# Patient Record
Sex: Female | Born: 1990 | Race: Black or African American | Hispanic: No | Marital: Single | Smoking: Never smoker
Health system: Southern US, Community
[De-identification: ages and names within clinical notes are randomized; demographics above are authoritative.]

## PROBLEM LIST (undated history)

## (undated) DIAGNOSIS — G35 Multiple sclerosis: Secondary | ICD-10-CM

## (undated) DIAGNOSIS — N809 Endometriosis, unspecified: Secondary | ICD-10-CM

## (undated) DIAGNOSIS — J45909 Unspecified asthma, uncomplicated: Secondary | ICD-10-CM

## (undated) HISTORY — PX: ABDOMINAL HYSTERECTOMY: SHX81

## (undated) HISTORY — PX: DILATION AND EVACUATION: SHX1459

---

## 2015-04-24 ENCOUNTER — Encounter (HOSPITAL_COMMUNITY): Payer: Self-pay | Admitting: *Deleted

## 2015-04-24 ENCOUNTER — Inpatient Hospital Stay (HOSPITAL_COMMUNITY)
Admission: AD | Admit: 2015-04-24 | Discharge: 2015-04-24 | Disposition: A | Discharge status: Home / Self Care | Payer: Medicaid - Out of State | Source: Ambulatory Visit | Attending: Obstetrics & Gynecology | Admitting: Obstetrics & Gynecology

## 2015-04-24 DIAGNOSIS — R05 Cough: Secondary | ICD-10-CM | POA: Diagnosis present

## 2015-04-24 DIAGNOSIS — J069 Acute upper respiratory infection, unspecified: Secondary | ICD-10-CM | POA: Insufficient documentation

## 2015-04-24 DIAGNOSIS — J45909 Unspecified asthma, uncomplicated: Secondary | ICD-10-CM | POA: Insufficient documentation

## 2015-04-24 DIAGNOSIS — J4521 Mild intermittent asthma with (acute) exacerbation: Secondary | ICD-10-CM | POA: Diagnosis not present

## 2015-04-24 HISTORY — DX: Unspecified asthma, uncomplicated: J45.909

## 2015-04-24 LAB — POCT PREGNANCY, URINE: PREG TEST UR: NEGATIVE

## 2015-04-24 MED ORDER — ALBUTEROL SULFATE HFA 108 (90 BASE) MCG/ACT IN AERS: 1.0000 | INHALATION_SPRAY | Freq: Four times a day (QID) | RESPIRATORY_TRACT | Status: DC | PRN

## 2015-04-24 NOTE — MAU Note (Signed)
Pt presents to MAU with complaints of pain in her back and chest since yesterday. Reports cough for approximately 3 days.

## 2015-04-24 NOTE — Discharge Instructions (Signed)
Upper Respiratory Infection, Adult An upper respiratory infection (URI) is also sometimes known as the common cold. The upper respiratory tract includes the nose, sinuses, throat, trachea, and bronchi. Bronchi are the airways leading to the lungs. Most people improve within 1 week, but symptoms can last up to 2 weeks. A residual cough may last even longer.  CAUSES Many different viruses can infect the tissues lining the upper respiratory tract. The tissues become irritated and inflamed and often become very moist. Mucus production is also common. A cold is contagious. You can easily spread the virus to others by oral contact. This includes kissing, sharing a glass, coughing, or sneezing. Touching your mouth or nose and then touching a surface, which is then touched by another person, can also spread the virus. SYMPTOMS  Symptoms typically develop 1 to 3 days after you come in contact with a cold virus. Symptoms vary from person to person. They may include:  Runny nose.  Sneezing.  Nasal congestion.  Sinus irritation.  Sore throat.  Loss of voice (laryngitis).  Cough.  Fatigue.  Muscle aches.  Loss of appetite.  Headache.  Low-grade fever. DIAGNOSIS  You might diagnose your own cold based on familiar symptoms, since most people get a cold 2 to 3 times a year. Your caregiver can confirm this based on your exam. Most importantly, your caregiver can check that your symptoms are not due to another disease such as strep throat, sinusitis, pneumonia, asthma, or epiglottitis. Blood tests, throat tests, and X-rays are not necessary to diagnose a common cold, but they may sometimes be helpful in excluding other more serious diseases. Your caregiver will decide if any further tests are required. RISKS AND COMPLICATIONS  You may be at risk for a more severe case of the common cold if you smoke cigarettes, have chronic heart disease (such as heart failure) or lung disease (such as asthma), or if  you have a weakened immune system. The very young and very old are also at risk for more serious infections. Bacterial sinusitis, middle ear infections, and bacterial pneumonia can complicate the common cold. The common cold can worsen asthma and chronic obstructive pulmonary disease (COPD). Sometimes, these complications can require emergency medical care and may be life-threatening. PREVENTION  The best way to protect against getting a cold is to practice good hygiene. Avoid oral or hand contact with people with cold symptoms. Wash your hands often if contact occurs. There is no clear evidence that vitamin C, vitamin E, echinacea, or exercise reduces the chance of developing a cold. However, it is always recommended to get plenty of rest and practice good nutrition. TREATMENT  Treatment is directed at relieving symptoms. There is no cure. Antibiotics are not effective, because the infection is caused by a virus, not by bacteria. Treatment may include:  Increased fluid intake. Sports drinks offer valuable electrolytes, sugars, and fluids.  Breathing heated mist or steam (vaporizer or shower).  Eating chicken soup or other clear broths, and maintaining good nutrition.  Getting plenty of rest.  Using gargles or lozenges for comfort.  Controlling fevers with ibuprofen or acetaminophen as directed by your caregiver.  Increasing usage of your inhaler if you have asthma. Zinc gel and zinc lozenges, taken in the first 24 hours of the common cold, can shorten the duration and lessen the severity of symptoms. Pain medicines may help with fever, muscle aches, and throat pain. A variety of non-prescription medicines are available to treat congestion and runny nose. Your caregiver   can make recommendations and may suggest nasal or lung inhalers for other symptoms.  HOME CARE INSTRUCTIONS   Only take over-the-counter or prescription medicines for pain, discomfort, or fever as directed by your  caregiver.  Use a warm mist humidifier or inhale steam from a shower to increase air moisture. This may keep secretions moist and make it easier to breathe.  Drink enough water and fluids to keep your urine clear or pale yellow.  Rest as needed.  Return to work when your temperature has returned to normal or as your caregiver advises. You may need to stay home longer to avoid infecting others. You can also use a face mask and careful hand washing to prevent spread of the virus. SEEK MEDICAL CARE IF:   After the first few days, you feel you are getting worse rather than better.  You need your caregiver's advice about medicines to control symptoms.  You develop chills, worsening shortness of breath, or brown or red sputum. These may be signs of pneumonia.  You develop yellow or brown nasal discharge or pain in the face, especially when you bend forward. These may be signs of sinusitis.  You develop a fever, swollen neck glands, pain with swallowing, or white areas in the back of your throat. These may be signs of strep throat. SEEK IMMEDIATE MEDICAL CARE IF:   You have a fever.  You develop severe or persistent headache, ear pain, sinus pain, or chest pain.  You develop wheezing, a prolonged cough, cough up blood, or have a change in your usual mucus (if you have chronic lung disease).  You develop sore muscles or a stiff neck. Document Released: 05/23/2001 Document Revised: 02/19/2012 Document Reviewed: 03/04/2014 Exit[employer] Patient Information 2015 [employer]. This information is not intended to replace advice given to you by your health care provider. Make sure you discuss any questions you have with your health care provider.  

## 2015-04-24 NOTE — MAU Provider Note (Signed)
  History     CSN: [id number]  Arrival date and time: 04/24/15 7510   First Provider Initiated Contact with Patient 04/24/15 [phone]      Chief Complaint  Patient presents with  . Nasal Congestion  . Asthma   HPI Leah Burns 24 y.o. [phone] nonpregnant female presents with 3 days of coughing, chest pain, yellow nasal discharge, sore throat.  Cough is productive of yellow-green mucus.  She reports temp of 101.1 yesterday one time but none since.  She has asthma but has lost her inhaler.  She denies nausea, vomiting, dysuria, vaginal bleeding or discharge.  Her best friend has recently had walking pneumonia.  Mucinex last night was not helpful.   OB History    Gravida Para Term Preterm AB TAB SAB Ectopic Multiple Living   2 2 2       2       Past Medical History  Diagnosis Date  . Asthma     Past Surgical History  Procedure Laterality Date  . Dilation and evacuation      History reviewed. No pertinent family history.  History  Substance Use Topics  . Smoking status: Never Smoker   . Smokeless tobacco: Not on file  . Alcohol Use: 0.6 oz/week    1 Glasses of wine per week    Allergies: No Known Allergies  Prescriptions prior to admission  Medication Sig Dispense Refill Last Dose  . gabapentin (NEURONTIN) 300 MG capsule Take 300 mg by mouth 2 (two) times daily.   Past Week at Unknown time    ROS Pertinent ROS in HPI.  All other systems are negative.   Physical Exam   Blood pressure 132/78, pulse 95, temperature 97.5 F (36.4 C), resp. rate 18, height 5\' 4"  (1.626 m), weight 151 lb (68.493 kg), last menstrual period 10/11/2013, SpO2 100 %.  Physical Exam  Constitutional: She is oriented to person, place, and time. She appears well-developed and well-nourished.  HENT:  Head: Normocephalic and atraumatic.  Throat is slightly erythematous.  No exudate.   Nares appear boggy  Eyes: EOM are normal.  Neck: Normal range of motion.  No cervical lymphadenopathy.    Cardiovascular: Normal rate, regular rhythm and normal heart sounds.   Respiratory: Effort normal and breath sounds normal. No respiratory distress.  GI: Soft. There is no tenderness.  Musculoskeletal: Normal range of motion.  Neurological: She is alert and oriented to person, place, and time.  Skin: Skin is warm and dry.  Psychiatric: She has a normal mood and affect.    MAU Course  Procedures  MDM Vitals are stable.  No evidence of bacterial infection.  Lungs are clear with O2 sat of 100%.   Presumed URI in setting of h/o asthma  Assessment and Plan  A: Upper Respiratory Infection Asthma  P: Discharge to home Albuterol MDI OTC cold medication Follow up Urgent Care if no improvement in several days.  Paticia Stack 04/24/2015, 9:40 AM

## 2017-07-20 ENCOUNTER — Other Ambulatory Visit: Payer: Self-pay | Admitting: Obstetrics and Gynecology

## 2017-07-20 DIAGNOSIS — Z1231 Encounter for screening mammogram for malignant neoplasm of breast: Secondary | ICD-10-CM

## 2017-07-26 ENCOUNTER — Ambulatory Visit: Payer: Medicaid - Out of State

## 2018-01-25 DIAGNOSIS — L0390 Cellulitis, unspecified: Secondary | ICD-10-CM | POA: Diagnosis not present

## 2018-01-26 DIAGNOSIS — N760 Acute vaginitis: Secondary | ICD-10-CM | POA: Diagnosis not present

## 2019-07-14 ENCOUNTER — Emergency Department (HOSPITAL_COMMUNITY)
Admission: EM | Admit: 2019-07-14 | Discharge: 2019-07-14 | Disposition: A | Discharge status: Home / Self Care | Payer: Medicaid - Out of State | Attending: Emergency Medicine | Admitting: Emergency Medicine

## 2019-07-14 ENCOUNTER — Emergency Department (HOSPITAL_COMMUNITY): Payer: Medicaid - Out of State

## 2019-07-14 ENCOUNTER — Encounter (HOSPITAL_COMMUNITY): Payer: Self-pay

## 2019-07-14 ENCOUNTER — Other Ambulatory Visit: Payer: Self-pay

## 2019-07-14 DIAGNOSIS — T07XXXA Unspecified multiple injuries, initial encounter: Secondary | ICD-10-CM

## 2019-07-14 DIAGNOSIS — S83422A Sprain of lateral collateral ligament of left knee, initial encounter: Secondary | ICD-10-CM

## 2019-07-14 DIAGNOSIS — Y93I9 Activity, other involving external motion: Secondary | ICD-10-CM | POA: Insufficient documentation

## 2019-07-14 DIAGNOSIS — Y929 Unspecified place or not applicable: Secondary | ICD-10-CM | POA: Diagnosis not present

## 2019-07-14 DIAGNOSIS — S80212A Abrasion, left knee, initial encounter: Secondary | ICD-10-CM | POA: Insufficient documentation

## 2019-07-14 DIAGNOSIS — J45909 Unspecified asthma, uncomplicated: Secondary | ICD-10-CM | POA: Diagnosis not present

## 2019-07-14 DIAGNOSIS — Y999 Unspecified external cause status: Secondary | ICD-10-CM | POA: Diagnosis not present

## 2019-07-14 DIAGNOSIS — L089 Local infection of the skin and subcutaneous tissue, unspecified: Secondary | ICD-10-CM

## 2019-07-14 DIAGNOSIS — S8992XA Unspecified injury of left lower leg, initial encounter: Secondary | ICD-10-CM | POA: Diagnosis present

## 2019-07-14 MED ORDER — MUPIROCIN CALCIUM 2 % EX CREA: TOPICAL_CREAM | Freq: Once | CUTANEOUS | Status: DC

## 2019-07-14 MED ORDER — MUPIROCIN 2 % EX OINT: 1.0000 "application " | TOPICAL_OINTMENT | Freq: Two times a day (BID) | CUTANEOUS | 0 refills | Status: AC

## 2019-07-14 MED ORDER — MUPIROCIN CALCIUM 2 % EX CREA
TOPICAL_CREAM | Freq: Once | CUTANEOUS | Status: AC
  Administered 2019-07-14: 1 via TOPICAL
  Filled 2019-07-14: qty 15

## 2019-07-14 MED ORDER — CEPHALEXIN 500 MG PO CAPS: ORAL_CAPSULE | ORAL | 0 refills | Status: DC
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
## 2019-07-14 NOTE — Discharge Instructions (Addendum)
Take the antibiotic if you begin having worsening redness, swelling, pain or streaking from the left knee abrasion.  Otherwise please try treating the area with Bactroban ointment for the next 48 hours.  Return immediately to the emergency department if you develop fevers chills or severe extension of the redness. You appear to have a sprain of the left knee.  Your x-ray was negative. Please use crutches as needed and then you may begin to advance weightbearing.  Apply ice over a towel to the knee 3-4 times a day for at least 20 minutes.  If you continue to have issues with your knee you will need to follow-up with orthopedics.
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
## 2019-08-15 ENCOUNTER — Emergency Department (HOSPITAL_COMMUNITY)
Admission: EM | Admit: 2019-08-15 | Discharge: 2019-08-15 | Disposition: A | Discharge status: Home / Self Care | Payer: Medicaid - Out of State | Attending: Emergency Medicine | Admitting: Emergency Medicine

## 2019-08-15 ENCOUNTER — Encounter (HOSPITAL_COMMUNITY): Payer: Self-pay | Admitting: Emergency Medicine

## 2019-08-15 ENCOUNTER — Other Ambulatory Visit: Payer: Self-pay

## 2019-08-15 DIAGNOSIS — M79641 Pain in right hand: Secondary | ICD-10-CM | POA: Insufficient documentation

## 2019-08-15 DIAGNOSIS — M25531 Pain in right wrist: Secondary | ICD-10-CM | POA: Diagnosis not present

## 2019-08-15 DIAGNOSIS — Z5321 Procedure and treatment not carried out due to patient leaving prior to being seen by health care provider: Secondary | ICD-10-CM | POA: Diagnosis not present

## 2019-08-15 NOTE — ED Notes (Signed)
Pt came out of treatment room states that her child is getting restless and needs to leave.
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
## 2019-09-17 ENCOUNTER — Ambulatory Visit: Payer: No Typology Code available for payment source | Admitting: Podiatry

## 2020-02-11 ENCOUNTER — Ambulatory Visit: Payer: Self-pay | Admitting: Family Medicine

## 2020-05-27 ENCOUNTER — Ambulatory Visit (INDEPENDENT_AMBULATORY_CARE_PROVIDER_SITE_OTHER): Payer: Medicaid Other | Admitting: Plastic Surgery

## 2020-05-27 ENCOUNTER — Encounter: Payer: Self-pay | Admitting: Plastic Surgery

## 2020-05-27 ENCOUNTER — Other Ambulatory Visit: Payer: Self-pay

## 2020-05-27 VITALS — BP 123/79 | HR 84 | Temp 97.9°F | Ht 63.0 in | Wt 146.0 lb

## 2020-05-27 DIAGNOSIS — M545 Low back pain, unspecified: Secondary | ICD-10-CM

## 2020-05-27 DIAGNOSIS — M4004 Postural kyphosis, thoracic region: Secondary | ICD-10-CM | POA: Diagnosis not present

## 2020-05-27 DIAGNOSIS — N62 Hypertrophy of breast: Secondary | ICD-10-CM

## 2020-05-27 DIAGNOSIS — M546 Pain in thoracic spine: Secondary | ICD-10-CM

## 2020-05-27 NOTE — Progress Notes (Addendum)
   Referring Provider Sue Lush, PA-C [address],  [place] [phone]   CC: No chief complaint on file. Back pain  Leah Burns is an 29 y.o. female.  HPI: Patient presents to discuss breast reduction.  She is had years of back pain, neck pain, shoulder pain related to her large breasts.  She is currently 59 double D and wants to be around a B cup.  She did go to physical therapy a little over a year ago with limited success.  She has had mammograms due to the palpable right breast lump that were negative.  She does get rashes beneath the folds on her breast that are treated with over-the-counter medications.  Her maternal grandmother did have breast cancer.  She has had no previous breast procedures or biopsies.  She does not smoke.  She is nondiabetic.  No Known Allergies  Outpatient Encounter Medications as of 05/27/2020  Medication Sig  . albuterol (PROVENTIL HFA;VENTOLIN HFA) 108 (90 BASE) MCG/ACT inhaler Inhale 1-2 puffs into the lungs every 6 (six) hours as needed for wheezing or shortness of breath.  . cephALEXin (KEFLEX) 500 MG capsule 2 caps po bid x 7 days  . gabapentin (NEURONTIN) 300 MG capsule Take 300 mg by mouth 2 (two) times daily.   No facility-administered encounter medications on file as of 05/27/2020.     Past Medical History:  Diagnosis Date  . Asthma     Past Surgical History:  Procedure Laterality Date  . DILATION AND EVACUATION      No family history on file.  Social History   Social History Narrative  . Not on file     Review of Systems General: Denies fevers, chills, weight loss CV: Denies chest pain, shortness of breath, palpitations  Physical Exam Vitals with BMI 05/27/2020 08/15/2019 07/14/2019  Height 5\' 3"  - -  Weight 146 lbs - -  BMI 09.38 - -  Systolic 182 993 716  Diastolic 79 63 76  Pulse 84 91 77    General:  No acute distress,  Alert and oriented, Non-Toxic, Normal speech and affect Breast: She has  grade 2 ptosis.  Sternal notch to nipple is 27 cm bilaterally nipple to fold is 19 cm on the right and 18 cm on the left.  I do not see any obvious scars or masses.  Assessment/Plan The patient has bilateral symptomatic macromastia.  She is a good candidate for a breast reduction.  She is interested in pursuing surgical treatment.  The details of breast reduction surgery were discussed.  I explained the procedure in detail along the with the expected scars.  The risks were discussed in detail and include bleeding, infection, damage to surrounding structures, need for additional procedures, nipple loss, change in nipple sensation, persistent pain, contour irregularities and asymmetries.  I explained that breast feeding is often not possible after breast reduction surgery.  We discussed the expected postoperative course with an overall recovery period of about 1 month.  She demonstrated full understanding of all risks.  We discussed her personal risk factors.  I anticipate approximately 390 g of tissue removed from each side.   Cindra Presume 05/27/2020, 1:47 PM

## 2020-08-25 ENCOUNTER — Telehealth (INDEPENDENT_AMBULATORY_CARE_PROVIDER_SITE_OTHER): Payer: Medicaid Other | Admitting: Surgical

## 2020-08-25 ENCOUNTER — Encounter: Payer: Self-pay | Admitting: Surgical

## 2020-08-25 ENCOUNTER — Other Ambulatory Visit: Payer: Self-pay

## 2020-08-25 ENCOUNTER — Ambulatory Visit: Payer: Medicaid Other | Admitting: Surgical

## 2020-08-25 DIAGNOSIS — M545 Low back pain, unspecified: Secondary | ICD-10-CM

## 2020-08-25 DIAGNOSIS — M4004 Postural kyphosis, thoracic region: Secondary | ICD-10-CM

## 2020-08-25 DIAGNOSIS — M546 Pain in thoracic spine: Secondary | ICD-10-CM

## 2020-08-25 DIAGNOSIS — N62 Hypertrophy of breast: Secondary | ICD-10-CM

## 2020-08-25 NOTE — Progress Notes (Signed)
Change to virtual visit

## 2020-08-25 NOTE — Progress Notes (Signed)
Patient ID: Leah Burns, female    DOB: 12/02/1991, 29 y.o.   MRN: [id number]  Chief Complaint  Patient presents with  . Pre-op Exam      ICD-10-CM   1. Macromastia  N62   2. Back pain of thoracolumbar region  M54.5    M54.6   3. Postural kyphosis, thoracic region  M40.04     History of Present Illness: Geoffrey Mankin is a 29 y.o.  female  with a history of macromastia.  She presents for virtual preoperative evaluation for upcoming procedure, bilateral breast reduction, scheduled for 09/14/2020 with Dr. Claudia Desanctis  The patient has not had problems with anesthesia. No history of DVT/PE.  No family history of DVT/PE.  No family or personal history of bleeding or clotting disorders.  Patient is not currently taking any blood thinners.  No history of CVA/MI.   Summary of Previous Visit: History of years of back pain, neck pain, shoulder grooving related to her large breasts.  Patient is currently a 66 double D and wants to be around a B cup.  Patient did go to physical therapy about a year ago with limited success.  She has had mammograms due to the palpable right breast lump that were negative.  Maternal grandmother with breast cancer.  No previous breast procedures or biopsies.  She does not smoke.  She is not diabetic.  Anticipate approximately 390 g of tissue removed from each breast.  Patient reports today that she would like to be a B/C cup, reports that she would like to be proportional to her size .  PMH Significant for: Recurrent major depressive disorder, anxiety, mild intermittent asthma.  Patient reports she has not had any recent issues with her asthma or need for her inhaler.  Patient recently evaluated yesterday by internal medicine at [facility], patient was tested for influenza and COVID-19, patient reports Covid test was negative.  She reports she is feeling a lot better today and has no complaints.  The patient gave consent to have this visit done by telemedicine /  virtual visit.  This is also consent for access the chart and treat the patient via this visit. The patient is located at home.  I, the provider, am at the office.  We spent 10 minutes together for the visit.  Joined by telephone.  Past Medical History: Allergies: No Known Allergies  Current Medications:  Current Outpatient Medications:  .  albuterol (PROVENTIL HFA;VENTOLIN HFA) 108 (90 BASE) MCG/ACT inhaler, Inhale 1-2 puffs into the lungs every 6 (six) hours as needed for wheezing or shortness of breath., Disp: 1 Inhaler, Rfl: 0 .  cephALEXin (KEFLEX) 500 MG capsule, 2 caps po bid x 7 days, Disp: 28 capsule, Rfl: 0 .  gabapentin (NEURONTIN) 300 MG capsule, Take 300 mg by mouth 2 (two) times daily., Disp: , Rfl:   Past Medical Problems: Past Medical History:  Diagnosis Date  . Asthma     Past Surgical History: Past Surgical History:  Procedure Laterality Date  . DILATION AND EVACUATION      Social History: Social History   Socioeconomic History  . Marital status: Single    Spouse name: Not on file  . Number of children: Not on file  . Years of education: Not on file  . Highest education level: Not on file  Occupational History  . Not on file  Tobacco Use  . Smoking status: Never Smoker  . Smokeless tobacco: Never Used  Substance and Sexual  Activity  . Alcohol use: Yes    Alcohol/week: 1.0 standard drink    Types: 1 Glasses of wine per week  . Drug use: Not on file  . Sexual activity: Yes    Birth control/protection: Injection  Other Topics Concern  . Not on file  Social History Narrative  . Not on file   Social Determinants of Health   Financial Resource Strain:   . Difficulty of Paying Living Expenses: Not on file  Food Insecurity:   . Worried About Charity fundraiser in the Last Year: Not on file  . Ran Out of Food in the Last Year: Not on file  Transportation Needs:   . Lack of Transportation (Medical): Not on file  . Lack of Transportation  (Non-Medical): Not on file  Physical Activity:   . Days of Exercise per Week: Not on file  . Minutes of Exercise per Session: Not on file  Stress:   . Feeling of Stress : Not on file  Social Connections:   . Frequency of Communication with Friends and Family: Not on file  . Frequency of Social Gatherings with Friends and Family: Not on file  . Attends Religious Services: Not on file  . Active Member of Clubs or Organizations: Not on file  . Attends Archivist Meetings: Not on file  . Marital Status: Not on file  Intimate Partner Violence:   . Fear of Current or Ex-Partner: Not on file  . Emotionally Abused: Not on file  . Physically Abused: Not on file  . Sexually Abused: Not on file    Family History: History reviewed. No pertinent family history.  Review of Systems: Review of Systems  Constitutional: Negative.   Respiratory: Negative.   Cardiovascular: Negative.   Gastrointestinal: Negative.     Physical Exam: Vital Signs There were no vitals taken for this visit. Virtual office visit   Assessment/Plan: The patient is scheduled for bilateral breast reduction with Dr. Claudia Desanctis.  Risks, benefits, and alternatives of procedure discussed, questions answered and consent obtained.    Smoking Status: non smoker; Counseling Given? NA Patient had right breast US 11/2018 which was normal.  Caprini Score: 2; Risk Factors include: length of planned surgery. Recommendation for mechanical prophylaxis during surgery. Encourage early ambulation.   Pictures obtained: @ consult  Post-op Rx sent to pharmacy: norco, discussed use of tylenol and ibuprofen for post-op pain as well.  Patient was unable to make an office appointment today, discussed with patient that prior to surgery she will need to come pick up her surgery packet and review the consent form for bilateral breast reduction surgery and general surgical risk consent form and will need to sign these.  I discussed with  her the risks of surgery today, but I recommend she come in to review these documents and we can schedule an additional call to discuss any questions she has about the consent/risks of surgery.  Patient is in agreement with this and will stop by the office prior to surgery.  The risk that can be encountered with breast reduction were discussed and include the following but not limited to these:  Breast asymmetry, fluid accumulation, firmness of the breast, inability to breast feed, loss of nipple or areola, skin loss, decrease or no nipple sensation, fat necrosis of the breast tissue, bleeding, infection, healing delay.  There are risks of anesthesia, changes to skin sensation and injury to nerves or blood vessels.  The muscle can be temporarily or permanently  injured.  You may have an allergic reaction to tape, suture, glue, blood products which can result in skin discoloration, swelling, pain, skin lesions, poor healing.  Any of these can lead to the need for revisonal surgery or stage procedures.  A reduction has potential to interfere with diagnostic procedures.  Nipple or breast piercing can increase risks of infection.  This procedure is best done when the breast is fully developed.  Changes in the breast will continue to occur over time.  Pregnancy can alter the outcomes of previous breast reduction surgery, weight gain and weigh loss can also effect the long term appearance.      Electronically signed by: Carola Rhine Ronnie Doo, PA-C 08/25/2020 11:08 AM

## 2020-08-25 NOTE — H&P (View-Only) (Signed)
Patient ID: Leah Burns, female    DOB: [birth year], 29 y.o.   MRN: [id number]  Chief Complaint  Patient presents with  . Pre-op Exam      ICD-10-CM   1. Macromastia  N62   2. Back pain of thoracolumbar region  M54.5    M54.6   3. Postural kyphosis, thoracic region  M40.04     History of Present Illness: Leah Burns is a 29 y.o.  female  with a history of macromastia.  She presents for virtual preoperative evaluation for upcoming procedure, bilateral breast reduction, scheduled for 09/14/2020 with Dr. Claudia Desanctis  The patient has not had problems with anesthesia. No history of DVT/PE.  No family history of DVT/PE.  No family or personal history of bleeding or clotting disorders.  Patient is not currently taking any blood thinners.  No history of CVA/MI.   Summary of Previous Visit: History of years of back pain, neck pain, shoulder grooving related to her large breasts.  Patient is currently a 11 double D and wants to be around a B cup.  Patient did go to physical therapy about a year ago with limited success.  She has had mammograms due to the palpable right breast lump that were negative.  Maternal grandmother with breast cancer.  No previous breast procedures or biopsies.  She does not smoke.  She is not diabetic.  Anticipate approximately 390 g of tissue removed from each breast.  Patient reports today that she would like to be a B/C cup, reports that she would like to be proportional to her size .  PMH Significant for: Recurrent major depressive disorder, anxiety, mild intermittent asthma.  Patient reports she has not had any recent issues with her asthma or need for her inhaler.  Patient recently evaluated yesterday by internal medicine at [facility], patient was tested for influenza and COVID-19, patient reports Covid test was negative.  She reports she is feeling a lot better today and has no complaints.  The patient gave consent to have this visit done by telemedicine /  virtual visit.  This is also consent for access the chart and treat the patient via this visit. The patient is located at home.  I, the provider, am at the office.  We spent 10 minutes together for the visit.  Joined by telephone.  Past Medical History: Allergies: No Known Allergies  Current Medications:  Current Outpatient Medications:  .  albuterol (PROVENTIL HFA;VENTOLIN HFA) 108 (90 BASE) MCG/ACT inhaler, Inhale 1-2 puffs into the lungs every 6 (six) hours as needed for wheezing or shortness of breath., Disp: 1 Inhaler, Rfl: 0 .  cephALEXin (KEFLEX) 500 MG capsule, 2 caps po bid x 7 days, Disp: 28 capsule, Rfl: 0 .  gabapentin (NEURONTIN) 300 MG capsule, Take 300 mg by mouth 2 (two) times daily., Disp: , Rfl:   Past Medical Problems: Past Medical History:  Diagnosis Date  . Asthma     Past Surgical History: Past Surgical History:  Procedure Laterality Date  . DILATION AND EVACUATION      Social History: Social History   Socioeconomic History  . Marital status: Single    Spouse name: Not on file  . Number of children: Not on file  . Years of education: Not on file  . Highest education level: Not on file  Occupational History  . Not on file  Tobacco Use  . Smoking status: Never Smoker  . Smokeless tobacco: Never Used  Substance and Sexual  Activity  . Alcohol use: Yes    Alcohol/week: 1.0 standard drink    Types: 1 Glasses of wine per week  . Drug use: Not on file  . Sexual activity: Yes    Birth control/protection: Injection  Other Topics Concern  . Not on file  Social History Narrative  . Not on file   Social Determinants of Health   Financial Resource Strain:   . Difficulty of Paying Living Expenses: Not on file  Food Insecurity:   . Worried About Charity fundraiser in the Last Year: Not on file  . Ran Out of Food in the Last Year: Not on file  Transportation Needs:   . Lack of Transportation (Medical): Not on file  . Lack of Transportation  (Non-Medical): Not on file  Physical Activity:   . Days of Exercise per Week: Not on file  . Minutes of Exercise per Session: Not on file  Stress:   . Feeling of Stress : Not on file  Social Connections:   . Frequency of Communication with Friends and Family: Not on file  . Frequency of Social Gatherings with Friends and Family: Not on file  . Attends Religious Services: Not on file  . Active Member of Clubs or Organizations: Not on file  . Attends Archivist Meetings: Not on file  . Marital Status: Not on file  Intimate Partner Violence:   . Fear of Current or Ex-Partner: Not on file  . Emotionally Abused: Not on file  . Physically Abused: Not on file  . Sexually Abused: Not on file    Family History: History reviewed. No pertinent family history.  Review of Systems: Review of Systems  Constitutional: Negative.   Respiratory: Negative.   Cardiovascular: Negative.   Gastrointestinal: Negative.     Physical Exam: Vital Signs There were no vitals taken for this visit. Virtual office visit   Assessment/Plan: The patient is scheduled for bilateral breast reduction with Dr. Claudia Desanctis.  Risks, benefits, and alternatives of procedure discussed, questions answered and consent obtained.    Smoking Status: non smoker; Counseling Given? NA Patient had right breast US 11/2018 which was normal.  Caprini Score: 2; Risk Factors include: length of planned surgery. Recommendation for mechanical prophylaxis during surgery. Encourage early ambulation.   Pictures obtained: @ consult  Post-op Rx sent to pharmacy: norco, discussed use of tylenol and ibuprofen for post-op pain as well.  Patient was unable to make an office appointment today, discussed with patient that prior to surgery she will need to come pick up her surgery packet and review the consent form for bilateral breast reduction surgery and general surgical risk consent form and will need to sign these.  I discussed with  her the risks of surgery today, but I recommend she come in to review these documents and we can schedule an additional call to discuss any questions she has about the consent/risks of surgery.  Patient is in agreement with this and will stop by the office prior to surgery.  The risk that can be encountered with breast reduction were discussed and include the following but not limited to these:  Breast asymmetry, fluid accumulation, firmness of the breast, inability to breast feed, loss of nipple or areola, skin loss, decrease or no nipple sensation, fat necrosis of the breast tissue, bleeding, infection, healing delay.  There are risks of anesthesia, changes to skin sensation and injury to nerves or blood vessels.  The muscle can be temporarily or permanently  injured.  You may have an allergic reaction to tape, suture, glue, blood products which can result in skin discoloration, swelling, pain, skin lesions, poor healing.  Any of these can lead to the need for revisonal surgery or stage procedures.  A reduction has potential to interfere with diagnostic procedures.  Nipple or breast piercing can increase risks of infection.  This procedure is best done when the breast is fully developed.  Changes in the breast will continue to occur over time.  Pregnancy can alter the outcomes of previous breast reduction surgery, weight gain and weigh loss can also effect the long term appearance.      Electronically signed by: Carola Rhine Khori Rosevear, PA-C 08/25/2020 11:08 AM

## 2020-09-02 ENCOUNTER — Telehealth: Payer: Self-pay | Admitting: Surgical

## 2020-09-02 ENCOUNTER — Other Ambulatory Visit: Payer: Self-pay | Admitting: Surgical

## 2020-09-02 MED ORDER — ONDANSETRON HCL 4 MG PO TABS: 4.0000 mg | ORAL_TABLET | Freq: Three times a day (TID) | ORAL | 0 refills | Status: DC | PRN

## 2020-09-02 MED ORDER — HYDROCODONE-ACETAMINOPHEN 5-325 MG PO TABS: 1.0000 | ORAL_TABLET | Freq: Four times a day (QID) | ORAL | 0 refills | Status: AC | PRN

## 2020-09-02 NOTE — Telephone Encounter (Signed)
Post op rx sent to pharmacy

## 2020-09-02 NOTE — Telephone Encounter (Signed)
Patient stopped by to get her surgery information and said her medications have not been sent in to her pharmacy yet. Please send surgery medications to [facility] on [address].

## 2020-09-02 NOTE — Progress Notes (Signed)
Post-op meds - norco/zofran

## 2020-09-07 ENCOUNTER — Other Ambulatory Visit: Payer: Self-pay

## 2020-09-07 ENCOUNTER — Encounter (HOSPITAL_BASED_OUTPATIENT_CLINIC_OR_DEPARTMENT_OTHER): Payer: Self-pay | Admitting: Plastic Surgery

## 2020-09-11 ENCOUNTER — Other Ambulatory Visit (HOSPITAL_COMMUNITY)
Admission: RE | Admit: 2020-09-11 | Discharge: 2020-09-11 | Disposition: A | Discharge status: Home / Self Care | Payer: Medicaid Other | Source: Ambulatory Visit | Attending: Plastic Surgery | Admitting: Plastic Surgery

## 2020-09-11 DIAGNOSIS — Z20822 Contact with and (suspected) exposure to covid-19: Secondary | ICD-10-CM | POA: Insufficient documentation

## 2020-09-11 DIAGNOSIS — Z01812 Encounter for preprocedural laboratory examination: Secondary | ICD-10-CM | POA: Insufficient documentation

## 2020-09-11 LAB — SARS CORONAVIRUS 2 (TAT 6-24 HRS): SARS Coronavirus 2: NEGATIVE

## 2020-09-14 ENCOUNTER — Encounter (HOSPITAL_BASED_OUTPATIENT_CLINIC_OR_DEPARTMENT_OTHER)
Admission: RE | Disposition: A | Discharge status: Home / Self Care | Payer: Self-pay | Source: Home / Self Care | Attending: Plastic Surgery

## 2020-09-14 ENCOUNTER — Ambulatory Visit (HOSPITAL_BASED_OUTPATIENT_CLINIC_OR_DEPARTMENT_OTHER)
Admission: RE | Admit: 2020-09-14 | Discharge: 2020-09-14 | Disposition: A | Discharge status: Home / Self Care | Payer: Medicaid Other | Attending: Plastic Surgery | Admitting: Plastic Surgery

## 2020-09-14 ENCOUNTER — Ambulatory Visit (HOSPITAL_BASED_OUTPATIENT_CLINIC_OR_DEPARTMENT_OTHER): Payer: Medicaid Other | Admitting: Certified Registered Nurse Anesthetist

## 2020-09-14 ENCOUNTER — Other Ambulatory Visit: Payer: Self-pay

## 2020-09-14 ENCOUNTER — Encounter (HOSPITAL_BASED_OUTPATIENT_CLINIC_OR_DEPARTMENT_OTHER): Payer: Self-pay | Admitting: Plastic Surgery

## 2020-09-14 DIAGNOSIS — D241 Benign neoplasm of right breast: Secondary | ICD-10-CM | POA: Insufficient documentation

## 2020-09-14 DIAGNOSIS — N62 Hypertrophy of breast: Secondary | ICD-10-CM | POA: Insufficient documentation

## 2020-09-14 DIAGNOSIS — Z803 Family history of malignant neoplasm of breast: Secondary | ICD-10-CM | POA: Insufficient documentation

## 2020-09-14 DIAGNOSIS — M4004 Postural kyphosis, thoracic region: Secondary | ICD-10-CM

## 2020-09-14 DIAGNOSIS — D242 Benign neoplasm of left breast: Secondary | ICD-10-CM | POA: Insufficient documentation

## 2020-09-14 DIAGNOSIS — M546 Pain in thoracic spine: Secondary | ICD-10-CM | POA: Insufficient documentation

## 2020-09-14 DIAGNOSIS — M545 Low back pain, unspecified: Secondary | ICD-10-CM

## 2020-09-14 HISTORY — PX: BREAST REDUCTION SURGERY: SHX8

## 2020-09-14 LAB — POCT PREGNANCY, URINE: Preg Test, Ur: NEGATIVE

## 2020-09-14 SURGERY — MAMMOPLASTY, REDUCTION
Anesthesia: General | Site: Breast | Laterality: Bilateral

## 2020-09-14 MED ORDER — LACTATED RINGERS IV SOLN: INTRAVENOUS | Status: DC

## 2020-09-14 MED ORDER — FENTANYL CITRATE (PF) 100 MCG/2ML IJ SOLN
INTRAMUSCULAR | Status: DC | PRN
Start: 2020-09-14 — End: 2020-09-14
  Administered 2020-09-14: 100 ug via INTRAVENOUS
  Administered 2020-09-14 (×2): 50 ug via INTRAVENOUS

## 2020-09-14 MED ORDER — SUGAMMADEX SODIUM 200 MG/2ML IV SOLN
INTRAVENOUS | Status: DC | PRN
  Administered 2020-09-14: 200 mg via INTRAVENOUS

## 2020-09-14 MED ORDER — CEFAZOLIN SODIUM-DEXTROSE 2-4 GM/100ML-% IV SOLN
INTRAVENOUS | Status: AC
  Filled 2020-09-14: qty 100

## 2020-09-14 MED ORDER — MIDAZOLAM HCL 5 MG/5ML IJ SOLN
INTRAMUSCULAR | Status: DC | PRN
  Administered 2020-09-14: 2 mg via INTRAVENOUS

## 2020-09-14 MED ORDER — OXYCODONE HCL 5 MG/5ML PO SOLN: 5.0000 mg | Freq: Once | ORAL | Status: DC | PRN

## 2020-09-14 MED ORDER — MIDAZOLAM HCL 2 MG/2ML IJ SOLN
INTRAMUSCULAR | Status: AC
  Filled 2020-09-14: qty 2

## 2020-09-14 MED ORDER — OXYCODONE HCL 5 MG PO TABS: 5.0000 mg | ORAL_TABLET | Freq: Once | ORAL | Status: DC | PRN

## 2020-09-14 MED ORDER — ONDANSETRON HCL 4 MG/2ML IJ SOLN: 4.0000 mg | Freq: Four times a day (QID) | INTRAMUSCULAR | Status: DC | PRN

## 2020-09-14 MED ORDER — FENTANYL CITRATE (PF) 100 MCG/2ML IJ SOLN
INTRAMUSCULAR | Status: AC
  Filled 2020-09-14: qty 2

## 2020-09-14 MED ORDER — PROPOFOL 500 MG/50ML IV EMUL
INTRAVENOUS | Status: DC | PRN
  Administered 2020-09-14: 25 ug/kg/min via INTRAVENOUS

## 2020-09-14 MED ORDER — ROCURONIUM BROMIDE 100 MG/10ML IV SOLN
INTRAVENOUS | Status: DC | PRN
  Administered 2020-09-14: 70 mg via INTRAVENOUS

## 2020-09-14 MED ORDER — FENTANYL CITRATE (PF) 100 MCG/2ML IJ SOLN
25.0000 ug | INTRAMUSCULAR | Status: DC | PRN
  Administered 2020-09-14: 50 ug via INTRAVENOUS
  Administered 2020-09-14 (×2): 25 ug via INTRAVENOUS

## 2020-09-14 MED ORDER — CEFAZOLIN SODIUM-DEXTROSE 2-4 GM/100ML-% IV SOLN
2.0000 g | INTRAVENOUS | Status: AC
  Administered 2020-09-14: 2 g via INTRAVENOUS

## 2020-09-14 MED ORDER — DEXAMETHASONE SODIUM PHOSPHATE 4 MG/ML IJ SOLN
INTRAMUSCULAR | Status: DC | PRN
  Administered 2020-09-14: 10 mg via INTRAVENOUS

## 2020-09-14 MED ORDER — LIDOCAINE HCL (CARDIAC) PF 100 MG/5ML IV SOSY
PREFILLED_SYRINGE | INTRAVENOUS | Status: DC | PRN
  Administered 2020-09-14: 60 mg via INTRAVENOUS

## 2020-09-14 MED ORDER — ONDANSETRON HCL 4 MG/2ML IJ SOLN
INTRAMUSCULAR | Status: DC | PRN
  Administered 2020-09-14: 4 mg via INTRAVENOUS

## 2020-09-14 MED ORDER — LACTATED RINGERS IV SOLN
INTRAVENOUS | Status: DC | PRN
  Administered 2020-09-14: 1000 mL

## 2020-09-14 MED ORDER — PROPOFOL 10 MG/ML IV BOLUS
INTRAVENOUS | Status: DC | PRN
  Administered 2020-09-14: 200 mg via INTRAVENOUS

## 2020-09-14 SURGICAL SUPPLY — 72 items
APL PRP STRL LF DISP 70% ISPRP (MISCELLANEOUS) ×2
APL SKNCLS STERI-STRIP NONHPOA (GAUZE/BANDAGES/DRESSINGS) ×2
BAG DECANTER FOR FLEXI CONT (MISCELLANEOUS) IMPLANT
BENZOIN TINCTURE PRP APPL 2/3 (GAUZE/BANDAGES/DRESSINGS) ×6 IMPLANT
BLADE SURG 10 STRL SS (BLADE) ×6 IMPLANT
BLADE SURG 15 STRL LF DISP TIS (BLADE) ×1 IMPLANT
BLADE SURG 15 STRL SS (BLADE) ×3
BNDG ELASTIC 6X5.8 VLCR STR LF (GAUZE/BANDAGES/DRESSINGS) ×6 IMPLANT
CANISTER SUCT 1200ML W/VALVE (MISCELLANEOUS) ×3 IMPLANT
CHLORAPREP W/TINT 26 (MISCELLANEOUS) ×6 IMPLANT
CLIP VESOCCLUDE MED 6/CT (CLIP) IMPLANT
COVER BACK TABLE 60X90IN (DRAPES) ×3 IMPLANT
COVER MAYO STAND STRL (DRAPES) ×3 IMPLANT
COVER WAND RF STERILE (DRAPES) IMPLANT
DECANTER SPIKE VIAL GLASS SM (MISCELLANEOUS) IMPLANT
DRAIN CHANNEL 15F RND FF W/TCR (WOUND CARE) IMPLANT
DRAPE LAPAROSCOPIC ABDOMINAL (DRAPES) ×3 IMPLANT
DRAPE UTILITY XL STRL (DRAPES) ×3 IMPLANT
DRSG PAD ABDOMINAL 8X10 ST (GAUZE/BANDAGES/DRESSINGS) ×6 IMPLANT
ELECT REM PT RETURN 9FT ADLT (ELECTROSURGICAL) ×3
ELECTRODE REM PT RTRN 9FT ADLT (ELECTROSURGICAL) ×1 IMPLANT
EVACUATOR SILICONE 100CC (DRAIN) IMPLANT
GAUZE SPONGE 4X4 12PLY STRL (GAUZE/BANDAGES/DRESSINGS) ×6 IMPLANT
GAUZE XEROFORM 5X9 LF (GAUZE/BANDAGES/DRESSINGS) IMPLANT
GLOVE BIO SURGEON STRL SZ 6.5 (GLOVE) ×2 IMPLANT
GLOVE BIO SURGEON STRL SZ7.5 (GLOVE) IMPLANT
GLOVE BIO SURGEONS STRL SZ 6.5 (GLOVE) ×1
GLOVE BIOGEL M 6.5 STRL (GLOVE) ×3 IMPLANT
GLOVE BIOGEL M STRL SZ7.5 (GLOVE) ×3 IMPLANT
GLOVE BIOGEL PI IND STRL 7.0 (GLOVE) ×3 IMPLANT
GLOVE BIOGEL PI IND STRL 8 (GLOVE) ×1 IMPLANT
GLOVE BIOGEL PI INDICATOR 7.0 (GLOVE) ×6
GLOVE BIOGEL PI INDICATOR 8 (GLOVE) ×2
GLOVE ECLIPSE 6.5 STRL STRAW (GLOVE) IMPLANT
GOWN STRL REUS W/ TWL LRG LVL3 (GOWN DISPOSABLE) ×3 IMPLANT
GOWN STRL REUS W/TWL LRG LVL3 (GOWN DISPOSABLE) ×9
MARKER SKIN DUAL TIP RULER LAB (MISCELLANEOUS) IMPLANT
NDL SAFETY ECLIPSE 18X1.5 (NEEDLE) ×1 IMPLANT
NEEDLE FILTER BLUNT 18X 1/2SAF (NEEDLE) ×2
NEEDLE FILTER BLUNT 18X1 1/2 (NEEDLE) ×1 IMPLANT
NEEDLE HYPO 18GX1.5 SHARP (NEEDLE) ×3
NEEDLE HYPO 25X1 1.5 SAFETY (NEEDLE) IMPLANT
NEEDLE SPNL 18GX3.5 QUINCKE PK (NEEDLE) ×3 IMPLANT
NS IRRIG 1000ML POUR BTL (IV SOLUTION) ×3 IMPLANT
PACK BASIN DAY SURGERY FS (CUSTOM PROCEDURE TRAY) ×3 IMPLANT
PENCIL SMOKE EVACUATOR (MISCELLANEOUS) ×3 IMPLANT
PIN SAFETY STERILE (MISCELLANEOUS) IMPLANT
SHEET MEDIUM DRAPE 40X70 STRL (DRAPES) IMPLANT
SLEEVE SCD COMPRESS KNEE MED (MISCELLANEOUS) ×3 IMPLANT
SPONGE LAP 18X18 RF (DISPOSABLE) ×9 IMPLANT
STAPLER INSORB 30 2030 C-SECTI (MISCELLANEOUS) ×3 IMPLANT
STAPLER VISISTAT 35W (STAPLE) ×3 IMPLANT
STRIP SUTURE WOUND CLOSURE 1/2 (MISCELLANEOUS) ×9 IMPLANT
SUT CHROMIC 4 0 PS 2 18 (SUTURE) IMPLANT
SUT ETHILON 2 0 FS 18 (SUTURE) IMPLANT
SUT ETHILON 3 0 PS 1 (SUTURE) IMPLANT
SUT MNCRL AB 4-0 PS2 18 (SUTURE) ×6 IMPLANT
SUT PDS 3-0 CT2 (SUTURE) ×9
SUT PDS II 3-0 CT2 27 ABS (SUTURE) ×3 IMPLANT
SUT VIC AB 3-0 PS1 18 (SUTURE)
SUT VIC AB 3-0 PS1 18XBRD (SUTURE) IMPLANT
SUT VLOC 90 P-14 23 (SUTURE) ×9 IMPLANT
SYR 50ML LL SCALE MARK (SYRINGE) ×3 IMPLANT
SYR BULB IRRIG 60ML STRL (SYRINGE) ×3 IMPLANT
SYR CONTROL 10ML LL (SYRINGE) IMPLANT
TAPE MEASURE VINYL STERILE (MISCELLANEOUS) IMPLANT
TOWEL GREEN STERILE FF (TOWEL DISPOSABLE) ×6 IMPLANT
TUBE CONNECTING 20'X1/4 (TUBING) ×1
TUBE CONNECTING 20X1/4 (TUBING) ×2 IMPLANT
TUBING INFILTRATION IT-10001 (TUBING) ×3 IMPLANT
UNDERPAD 30X36 HEAVY ABSORB (UNDERPADS AND DIAPERS) ×6 IMPLANT
YANKAUER SUCT BULB TIP NO VENT (SUCTIONS) ×3 IMPLANT

## 2020-09-14 NOTE — Transfer of Care (Signed)
Immediate Anesthesia Transfer of Care Note  Patient: Surveyor, mining  Procedure(s) Performed: BILATERAL MAMMARY REDUCTION  (BREAST) (Bilateral Breast)  Patient Location: PACU  Anesthesia Type:General  Level of Consciousness: awake, alert , oriented and patient cooperative  Airway & Oxygen Therapy: Patient Spontanous Breathing and Patient connected to face mask oxygen  Post-op Assessment: Report given to RN and Post -op Vital signs reviewed and stable  Post vital signs: Reviewed and stable  Last Vitals:  Vitals Value Taken Time  BP    Temp    Pulse    Resp    SpO2      Last Pain:  Vitals:   09/14/20 0639  TempSrc: Oral  PainSc: 0-No pain         Complications: No complications documented.

## 2020-09-14 NOTE — Anesthesia Procedure Notes (Signed)
Procedure Name: Intubation Date/Time: 09/14/2020 7:45 AM Performed by: Signe Colt, CRNA Pre-anesthesia Checklist: Patient identified, Emergency Drugs available, Suction available and Patient being monitored Patient Re-evaluated:Patient Re-evaluated prior to induction Oxygen Delivery Method: Circle system utilized Preoxygenation: Pre-oxygenation with 100% oxygen Induction Type: IV induction Ventilation: Mask ventilation without difficulty Laryngoscope Size: Mac and 3 Grade View: Grade I Tube type: Oral Tube size: 7.0 mm Number of attempts: 1 Airway Equipment and Method: Stylet and Oral airway Placement Confirmation: ETT inserted through vocal cords under direct vision,  positive ETCO2 and breath sounds checked- equal and bilateral Secured at: 21 cm Tube secured with: Tape Dental Injury: Teeth and Oropharynx as per pre-operative assessment

## 2020-09-14 NOTE — Anesthesia Postprocedure Evaluation (Signed)
Anesthesia Post Note  Patient: Leah Burns, mining  Procedure(s) Performed: BILATERAL MAMMARY REDUCTION  (BREAST) (Bilateral Breast)     Patient location during evaluation: PACU Anesthesia Type: General Level of consciousness: awake and alert Pain management: pain level controlled Vital Signs Assessment: post-procedure vital signs reviewed and stable Respiratory status: spontaneous breathing, nonlabored ventilation, respiratory function stable and patient connected to nasal cannula oxygen Cardiovascular status: blood pressure returned to baseline and stable Postop Assessment: no apparent nausea or vomiting Anesthetic complications: no   No complications documented.  Last Vitals:  Vitals:   09/14/20 1000 09/14/20 1014  BP: (!) 113/59 117/68  Pulse: 89 85  Resp: 18 13  Temp:    SpO2: 100% 100%    Last Pain:  Vitals:   09/14/20 1014  TempSrc:   PainSc: 5                  Kerem Gilmer S

## 2020-09-14 NOTE — Anesthesia Preprocedure Evaluation (Signed)
Anesthesia Evaluation  Patient identified by MRN, date of birth, ID band Patient awake    Reviewed: Allergy & Precautions, H&P , NPO status , Patient's Chart, lab work & pertinent test results  Airway Mallampati: II   Neck ROM: full    Dental   Pulmonary asthma ,    breath sounds clear to auscultation       Cardiovascular negative cardio ROS   Rhythm:regular Rate:Normal     Neuro/Psych    GI/Hepatic   Endo/Other    Renal/GU      Musculoskeletal   Abdominal   Peds  Hematology   Anesthesia Other Findings   Reproductive/Obstetrics                             Anesthesia Physical Anesthesia Plan  ASA: II  Anesthesia Plan: General   Post-op Pain Management:    Induction: Intravenous  PONV Risk Score and Plan: 3 and Ondansetron, Dexamethasone, Midazolam and Treatment may vary due to age or medical condition  Airway Management Planned: LMA  Additional Equipment:   Intra-op Plan:   Post-operative Plan: Extubation in OR  Informed Consent: I have reviewed the patients History and Physical, chart, labs and discussed the procedure including the risks, benefits and alternatives for the proposed anesthesia with the patient or authorized representative who has indicated his/her understanding and acceptance.       Plan Discussed with: CRNA, Anesthesiologist and Surgeon  Anesthesia Plan Comments:         Anesthesia Quick Evaluation

## 2020-09-14 NOTE — Brief Op Note (Signed)
09/14/2020  9:33 AM  PATIENT:  Leah Burns  29 y.o. female  PRE-OPERATIVE DIAGNOSIS:  macromastia  POST-OPERATIVE DIAGNOSIS:  macromastia  PROCEDURE:  Procedure(s): BILATERAL MAMMARY REDUCTION  (BREAST) (Bilateral)  SURGEON:  Surgeon(s) and Role:    * Maciej Schweitzer, Steffanie Dunn, MD - Primary  PHYSICIAN ASSISTANT: Phoebe Sharps, PA  ASSISTANTS: none   ANESTHESIA:   general  EBL:  30   BLOOD ADMINISTERED:none  DRAINS: none   LOCAL MEDICATIONS USED:  MARCAINE     SPECIMEN:  Source of Specimen:  r and l breast tissue  DISPOSITION OF SPECIMEN:  PATHOLOGY  COUNTS:  YES  TOURNIQUET:  * No tourniquets in log *  DICTATION: .Dragon Dictation  PLAN OF CARE: Discharge to home after PACU  PATIENT DISPOSITION:  PACU - hemodynamically stable.   Delay start of Pharmacological VTE agent (>24hrs) due to surgical blood loss or risk of bleeding: not applicable

## 2020-09-14 NOTE — Op Note (Signed)
Operative Note   DATE OF OPERATION: 09/14/2020  LOCATION: [facility]   SURGICAL DEPARTMENT: Plastic Surgery  PREOPERATIVE DIAGNOSES: Bilateral symptomatic macromastia.  POSTOPERATIVE DIAGNOSES:  same  PROCEDURE: Bilateral breast reduction with superomedial pedicle.  SURGEON: Talmadge Coventry, MD  ASSISTANT: Phoebe Sharps, PA The advanced practice practitioner (APP) assisted throughout the case.  The APP was essential in retraction and counter traction when needed to make the case progress smoothly.  This retraction and assistance made it possible to see the tissue plans for the procedure.  The assistance was needed for blood control, tissue re-approximation and assisted with closure of the incision site.  ANESTHESIA: General.  COMPLICATIONS: None.   INDICATIONS FOR PROCEDURE:  The patient, Leah Burns is a 29 y.o. female born on [birth year], is here for treatment of bilateral symptomatic macromastia. MRN: [id number]  CONSENT:  Informed consent was obtained directly from the patient. Risks, benefits and alternatives were fully discussed. Specific risks including but not limited to bleeding, infection, hematoma, seroma, scarring, pain, infection, contracture, asymmetry, wound healing problems, and need for further surgery were all discussed. The patient did have an ample opportunity to have questions answered to satisfaction.   DESCRIPTION OF PROCEDURE:  The patient was marked preoperatively for a Wise pattern skin excision.  The patient was taken to the operating room. SCDs were placed and antibiotics were given. General anesthesia was administered.The patient's operative site was prepped and draped in a sterile fashion. A time out was performed and all information was confirmed to be correct.  Right Breast: The breast was infiltrated with tumescent solution to help with hemostasis.  The nipple was marked with a cookie cutter.  A superomedial pedicle was drawn out  with the base of at least 8 cm in size.  A breast tourniquet was then applied and the pedicle was de-epithelialized.  Breast tourniquet was then let down and all incisions were made with a 10 blade.  The pedicle was then isolated down to the chest wall with cautery and the excision was performed removing tissue primarily inferiorly and laterally.  Hemostasis was obtained and the wound was stapled closed.  Left breast:  The breast was infiltrated with tumescent solution to help with hemostasis.  The nipple was marked with a cookie cutter.  A superomedial pedicle was drawn out with the base of at least 8 cm in size.  A breast tourniquet was then applied and the pedicle was de-epithelialized.  Breast tourniquet was then let down and all incisions were made with a 10 blade.  The pedicle was then isolated down to the chest wall with cautery and the excision was performed removing tissue primarily inferiorly and laterally.  Hemostasis was obtained and the wound was stapled closed.  Patient was then set up to check for size and symmetry.  Minor modifications were made.  This resulted in a total of 399 g removed from the right side and 403 g removed from the left side.  The inframammary incision was closed with a combination of buried in-sorb staples and a running 3-0 Quill suture.  The vertical and periareolar limbs were closed with interrupted buried 4-0 Monocryl and a running 4-0 Quill suture.  Steri-Strips were then applied along with a soft dressing and Ace wrap.  The patient tolerated the procedure well.  There were no complications. The patient was allowed to wake from anesthesia, extubated and taken to the recovery room in satisfactory condition.  I was present for the entire procedure.

## 2020-09-14 NOTE — Discharge Instructions (Signed)
Activity As tolerated: NO showers for 3 days No heavy activities  Diet: Regular  Wound Care: Keep dressing clean & dry for 3 days.  Keep wrap applied with compression as much as possible 24/7.    Do not change dressings for 3 days unless soiled.  Can change if needed but make sure to reapply wrap. After three days can remove wrap and shower.  Then reapply dressings if needed and continue compression with wrap or soft sports bra. Call doctor if any unusual problems occur such as pain, excessive bleeding, unrelieved nausea/vomiting, fever &/or chills  Follow-up appointment: Scheduled for next week. 10/13   Post Anesthesia Home Care Instructions  Activity: Get plenty of rest for the remainder of the day. A responsible individual must stay with you for 24 hours following the procedure.  For the next 24 hours, DO NOT: -Drive a car -Paediatric nurse -Drink alcoholic beverages -Take any medication unless instructed by your physician -Make any legal decisions or sign important papers.  Meals: Start with liquid foods such as gelatin or soup. Progress to regular foods as tolerated. Avoid greasy, spicy, heavy foods. If nausea and/or vomiting occur, drink only clear liquids until the nausea and/or vomiting subsides. Call your physician if vomiting continues.  Special Instructions/Symptoms: Your throat may feel dry or sore from the anesthesia or the breathing tube placed in your throat during surgery. If this causes discomfort, gargle with warm salt water. The discomfort should disappear within 24 hours.  If you had a scopolamine patch placed behind your ear for the management of post- operative nausea and/or vomiting:  1. The medication in the patch is effective for 72 hours, after which it should be removed.  Wrap patch in a tissue and discard in the trash. Wash hands thoroughly with soap and water. 2. You may remove the patch earlier than 72 hours if you experience unpleasant side effects  which may include dry mouth, dizziness or visual disturbances. 3. Avoid touching the patch. Wash your hands with soap and water after contact with the patch.

## 2020-09-14 NOTE — Interval H&P Note (Signed)
History and Physical Interval Note:  09/14/2020 7:34 AM  Leah Burns  has presented today for surgery, with the diagnosis of macromastia.  The various methods of treatment have been discussed with the patient and family. After consideration of risks, benefits and other options for treatment, the patient has consented to  Procedure(s): MAMMARY REDUCTION  (BREAST) (Bilateral) as a surgical intervention.  The patient's history has been reviewed, patient examined, no change in status, stable for surgery.  I have reviewed the patient's chart and labs.  Questions were answered to the patient's satisfaction.     Cindra Presume

## 2020-09-15 LAB — SURGICAL PATHOLOGY

## 2020-09-16 ENCOUNTER — Telehealth: Payer: Self-pay | Admitting: Surgical

## 2020-09-16 ENCOUNTER — Encounter (HOSPITAL_BASED_OUTPATIENT_CLINIC_OR_DEPARTMENT_OTHER): Payer: Self-pay | Admitting: Plastic Surgery

## 2020-09-16 NOTE — Telephone Encounter (Signed)
Returned pt's call regarding her compression wraps She states that the ace wraps are so tight & uncomfortable that she is having pain in her chest & rib area & difficulty taking a deep breath.  She is requesting to remove the wraps or loosen them.  She also is asking if she can use sports bra after tomorrow. I instructed her to have assistance in removing the ace wraps & to reapply them & keep in place through tomorrow. She understands that she can shower tomorrow & then she can use supportive sports bra & apply one ace wrap over the sports bra.  We discussed the importance of using compression until she is seen next week at her f/u visit. She reports that she has not had pain that required RX pain meds & is using OTC Tylenol if needed. She denies any fever,chills,nausea/vomit or other complications. She is reminded to call for any concerns.

## 2020-09-16 NOTE — Telephone Encounter (Signed)
Patient called to say the wrap is making it hard for her to breath and is hurting her ribs and she would like to know if she can loosen it or purchase a compression sports bra from [employer]. Tomorrow she can take it off and she wants to know if the compression bra can be worn after that. Please call to advise.

## 2020-09-22 ENCOUNTER — Encounter: Payer: Self-pay | Admitting: Plastic Surgery

## 2020-09-22 ENCOUNTER — Other Ambulatory Visit: Payer: Self-pay

## 2020-09-22 ENCOUNTER — Ambulatory Visit (INDEPENDENT_AMBULATORY_CARE_PROVIDER_SITE_OTHER): Payer: Medicaid Other | Admitting: Plastic Surgery

## 2020-09-22 VITALS — BP 119/81 | HR 98 | Temp 98.1°F

## 2020-09-22 DIAGNOSIS — N62 Hypertrophy of breast: Secondary | ICD-10-CM

## 2020-09-22 NOTE — Progress Notes (Signed)
Patient presents 1 week postop from bilateral breast reduction.  She is really happy and everything seems to be going well.  On exam all the incisions are healing nicely and the nipple areolar complexes are viable and sensate.  I do not detect any subcutaneous fluid.  I encouraged her to avoid strenuous activity and continue wearing a compressive garment we will plan to see her again in a few weeks.

## 2020-10-04 DIAGNOSIS — Z9889 Other specified postprocedural states: Secondary | ICD-10-CM | POA: Insufficient documentation

## 2020-10-04 NOTE — Progress Notes (Signed)
Patient is a 29 year old female here for follow-up after undergoing bilateral breast reduction on 09/14/2020 with Dr. Claudia Desanctis.  ~ 3 weeks PO She reports she is doing very well.  Has increased sensation in bilateral NAC's.  Bilateral incisions are healing very nicely, C/D/I.  No signs of infection, redness, drainage, seroma/hematoma.  Lateral ends of IMF incisions bilaterally are slightly raised.  She may do firm circular massage over these areas to encourage them to continue to remodel flatter.  Removed the remaining Steri-Strips around the NAC and vertical limb.  Patient denies fever/chills, nausea/vomiting.  She is very pleased with her results.  Continue to wear sports bra 24/7 for 3 more weeks.  Continue to avoid heavy lifting and vigorous activity for another 2 weeks and then may increase gradually as tolerated.  She may shower normally.  She may utilize any kind of scar cream or unscented moisturizer as desired.  Follow-up in 4 weeks.  Call office with any questions/concerns.

## 2020-10-06 ENCOUNTER — Other Ambulatory Visit: Payer: Self-pay

## 2020-10-06 ENCOUNTER — Encounter: Payer: Self-pay | Admitting: Plastic Surgery

## 2020-10-06 ENCOUNTER — Ambulatory Visit (INDEPENDENT_AMBULATORY_CARE_PROVIDER_SITE_OTHER): Payer: Medicaid Other | Admitting: Plastic Surgery

## 2020-10-06 VITALS — BP 110/74 | HR 84 | Temp 97.9°F

## 2020-10-06 DIAGNOSIS — Z9889 Other specified postprocedural states: Secondary | ICD-10-CM

## 2020-10-31 NOTE — Progress Notes (Signed)
Patient is a 29 year old female here for follow-up after undergoing bilateral breast reduction on 09/14/2020 with Dr. Claudia Desanctis.  ~ 7 weeks PO Patient reports she is doing very well.  She is pleased with her results.  Denies fever/chills, nausea/vomiting, pain.  Bilateral incisions are healing very nicely, C/D/I.  No signs of infection, redness, drainage, seroma/hematoma.  A small suture knot was removed from the left lateral incision that is causing the patient discomfort.  She may begin to gradually resume normal activities as tolerated.  May shower normally.  Follow-up as needed.  Return precautions given.  Call office with any questions/concerns.  Pictures were obtained of the patient and placed in the chart with the patient's or guardian's permission.

## 2020-11-03 ENCOUNTER — Ambulatory Visit (INDEPENDENT_AMBULATORY_CARE_PROVIDER_SITE_OTHER): Payer: Medicaid Other | Admitting: Plastic Surgery

## 2020-11-03 ENCOUNTER — Other Ambulatory Visit: Payer: Self-pay

## 2020-11-03 ENCOUNTER — Encounter: Payer: Self-pay | Admitting: Plastic Surgery

## 2020-11-03 VITALS — BP 106/59 | HR 80 | Temp 97.6°F

## 2020-11-03 DIAGNOSIS — Z9889 Other specified postprocedural states: Secondary | ICD-10-CM

## 2020-11-18 ENCOUNTER — Emergency Department (HOSPITAL_COMMUNITY)
Admission: EM | Admit: 2020-11-18 | Discharge: 2020-11-18 | Disposition: A | Discharge status: Home / Self Care | Payer: Medicaid Other | Attending: Emergency Medicine | Admitting: Emergency Medicine

## 2020-11-18 ENCOUNTER — Other Ambulatory Visit: Payer: Self-pay

## 2020-11-18 ENCOUNTER — Encounter (HOSPITAL_COMMUNITY): Payer: Self-pay

## 2020-11-18 DIAGNOSIS — R109 Unspecified abdominal pain: Secondary | ICD-10-CM | POA: Insufficient documentation

## 2020-11-18 DIAGNOSIS — Z5321 Procedure and treatment not carried out due to patient leaving prior to being seen by health care provider: Secondary | ICD-10-CM | POA: Diagnosis not present

## 2020-11-18 DIAGNOSIS — N939 Abnormal uterine and vaginal bleeding, unspecified: Secondary | ICD-10-CM | POA: Insufficient documentation

## 2020-11-18 HISTORY — DX: Endometriosis, unspecified: N80.9

## 2020-11-18 HISTORY — DX: Multiple sclerosis: G35

## 2020-11-18 NOTE — ED Notes (Signed)
Pt turned in labels and sts she made an apt to see her PCP tom

## 2020-11-18 NOTE — ED Triage Notes (Signed)
Patient states she started Boric acid suppositories 4 days ago. Patient states she was doing this for a yeast infection. Patient states she is having heavy vaginal bleeding and clots.

## 2020-12-02 ENCOUNTER — Ambulatory Visit: Payer: Medicaid Other | Admitting: Neurology

## 2020-12-06 ENCOUNTER — Other Ambulatory Visit (HOSPITAL_COMMUNITY): Payer: Self-pay | Admitting: Unknown Physician Specialty

## 2020-12-06 DIAGNOSIS — G35 Multiple sclerosis: Secondary | ICD-10-CM

## 2020-12-15 ENCOUNTER — Encounter (HOSPITAL_COMMUNITY): Payer: Self-pay

## 2020-12-15 ENCOUNTER — Emergency Department (HOSPITAL_COMMUNITY): Payer: Medicaid Other

## 2020-12-15 ENCOUNTER — Other Ambulatory Visit: Payer: Self-pay

## 2020-12-15 ENCOUNTER — Emergency Department (HOSPITAL_COMMUNITY)
Admission: EM | Admit: 2020-12-15 | Discharge: 2020-12-15 | Disposition: A | Discharge status: Home / Self Care | Payer: Medicaid Other | Attending: Emergency Medicine | Admitting: Emergency Medicine

## 2020-12-15 DIAGNOSIS — R519 Headache, unspecified: Secondary | ICD-10-CM | POA: Diagnosis not present

## 2020-12-15 DIAGNOSIS — R202 Paresthesia of skin: Secondary | ICD-10-CM | POA: Insufficient documentation

## 2020-12-15 DIAGNOSIS — H538 Other visual disturbances: Secondary | ICD-10-CM | POA: Diagnosis present

## 2020-12-15 DIAGNOSIS — J45909 Unspecified asthma, uncomplicated: Secondary | ICD-10-CM | POA: Diagnosis not present

## 2020-12-15 DIAGNOSIS — H5462 Unqualified visual loss, left eye, normal vision right eye: Secondary | ICD-10-CM | POA: Diagnosis not present

## 2020-12-15 DIAGNOSIS — H5789 Other specified disorders of eye and adnexa: Secondary | ICD-10-CM | POA: Diagnosis not present

## 2020-12-15 DIAGNOSIS — Z20822 Contact with and (suspected) exposure to covid-19: Secondary | ICD-10-CM | POA: Insufficient documentation

## 2020-12-15 DIAGNOSIS — M25512 Pain in left shoulder: Secondary | ICD-10-CM | POA: Diagnosis not present

## 2020-12-15 LAB — CBC
HCT: 38.8 % (ref 36.0–46.0)
Hemoglobin: 12.9 g/dL (ref 12.0–15.0)
MCH: 28.7 pg (ref 26.0–34.0)
MCHC: 33.2 g/dL (ref 30.0–36.0)
MCV: 86.4 fL (ref 80.0–100.0)
Platelets: 167 10*3/uL (ref 150–400)
RBC: 4.49 MIL/uL (ref 3.87–5.11)
RDW: 13.9 % (ref 11.5–15.5)
WBC: 7.4 10*3/uL (ref 4.0–10.5)
nRBC: 0 % (ref 0.0–0.2)

## 2020-12-15 LAB — BASIC METABOLIC PANEL
Anion gap: 7 (ref 5–15)
BUN: 14 mg/dL (ref 6–20)
CO2: 22 mmol/L (ref 22–32)
Calcium: 8.8 mg/dL — ABNORMAL LOW (ref 8.9–10.3)
Chloride: 110 mmol/L (ref 98–111)
Creatinine, Ser: 0.93 mg/dL (ref 0.44–1.00)
GFR, Estimated: >60 mL/min (ref >60)
Glucose, Bld: 101 mg/dL — ABNORMAL HIGH (ref 70–99)
Potassium: 3.9 mmol/L (ref 3.5–5.1)
Sodium: 139 mmol/L (ref 135–145)

## 2020-12-15 LAB — SEDIMENTATION RATE: Sed Rate: 1 mm/hr (ref 0–22)

## 2020-12-15 LAB — SARS CORONAVIRUS 2 BY RT PCR (HOSPITAL ORDER, PERFORMED IN ~~LOC~~ HOSPITAL LAB): SARS Coronavirus 2: NEGATIVE

## 2020-12-15 MED ORDER — TETRACAINE HCL 0.5 % OP SOLN
2.0000 [drp] | Freq: Once | OPHTHALMIC | Status: AC
  Administered 2020-12-15: 2 [drp] via OPHTHALMIC
  Filled 2020-12-15: qty 4

## 2020-12-15 MED ORDER — GADOBUTROL 1 MMOL/ML IV SOLN
7.0000 mL | Freq: Once | INTRAVENOUS | Status: AC | PRN
  Administered 2020-12-15: 7 mL via INTRAVENOUS

## 2020-12-15 MED ORDER — FLUORESCEIN SODIUM 1 MG OP STRP
1.0000 | ORAL_STRIP | Freq: Once | OPHTHALMIC | Status: AC
  Administered 2020-12-15: 1 via OPHTHALMIC
  Filled 2020-12-15: qty 1

## 2020-12-15 NOTE — ED Notes (Signed)
Pt currently in MRI

## 2020-12-15 NOTE — ED Notes (Signed)
An After Visit Summary was printed and given to the patient. Discharge instructions given and no further questions at this time.  

## 2020-12-15 NOTE — ED Triage Notes (Signed)
Patient arrived stating she has MS and is having a flare up, reports pressure behind her left eye and complaints of dizziness. Reports symptoms started last night.

## 2020-12-15 NOTE — ED Provider Notes (Signed)
[place] DEPT Provider Note   CSN: [id number] Arrival date & time: 12/15/20  0630     History Chief Complaint  Patient presents with  . Loss of Vision    Leah Burns is a 30 y.o. female.  HPI Patient presents with headache vision change and some left eye swelling.  States yesterday developed some mild change in her vision but woke up this morning with decreased vision in the left eye and swelling on the left eye.  States there is pain with movement of the eye.  States that light also bothers her.  No fevers.  History of MS and thinks she is flaring up.  No trauma.  Also has numbness in her left arm.  States is been going for a while.  Saw neurology around a month ago.  Had an MRI ordered but has not been done yet.  No fevers or chills.  Is going to see Dr. Felecia Shelling for neurology but has not seen him yet.    Past Medical History:  Diagnosis Date  . Asthma   . Endometriosis   . MS (multiple sclerosis) [facility])     Patient Active Problem List   Diagnosis Date Noted  . S/P bilateral breast reduction 10/04/2020    Past Surgical History:  Procedure Laterality Date  . ABDOMINAL HYSTERECTOMY     partial  . BREAST REDUCTION SURGERY Bilateral 09/14/2020   Procedure: BILATERAL MAMMARY REDUCTION  (BREAST);  Surgeon: Cindra Presume, MD;  Location: [place];  Service: Plastics;  Laterality: Bilateral;  . DILATION AND EVACUATION       OB History    Gravida  2   Para  2   Term  2   Preterm      AB      Living  2     SAB      IAB      Ectopic      Multiple      Live Births              Family History  Problem Relation Age of Onset  . Healthy Mother   . Hypertension Father     Social History   Tobacco Use  . Smoking status: Never Smoker  . Smokeless tobacco: Never Used  Vaping Use  . Vaping Use: Never used  Substance Use Topics  . Alcohol use: Yes    Alcohol/week: 1.0 standard drink    Types: 1 Glasses  of wine per week    Comment: occas  . Drug use: Never    Home Medications Prior to Admission medications   Medication Sig Start Date End Date Taking? Authorizing Provider  albuterol (PROVENTIL HFA;VENTOLIN HFA) 108 (90 BASE) MCG/ACT inhaler Inhale 1-2 puffs into the lungs every 6 (six) hours as needed for wheezing or shortness of breath. 04/24/15   Jaclyn Prime, Collene Leyden, PA-C  ondansetron (ZOFRAN) 4 MG tablet Take 1 tablet (4 mg total) by mouth every 8 (eight) hours as needed for nausea or vomiting. 09/02/20   Scheeler, Carola Rhine, PA-C    Allergies    Patient has no known allergies.  Review of Systems   Review of Systems  Constitutional: Negative for appetite change.  HENT: Negative for congestion.   Eyes: Positive for photophobia, pain and visual disturbance. Negative for discharge.       Swelling of eyelid.   Respiratory: Negative for shortness of breath.   Cardiovascular: Negative for chest pain.  Gastrointestinal: Negative  for abdominal pain.  Genitourinary: Negative for flank pain.  Musculoskeletal: Negative for back pain.       Left shoulder pain.  Skin: Negative for rash.  Neurological: Positive for numbness. Negative for weakness.  Psychiatric/Behavioral: Negative for confusion.    Physical Exam Updated Vital Signs BP 112/86   Pulse 71   Temp 98.7 F (37.1 C) (Oral)   Resp 18   LMP 12/14/2020   SpO2 100%   Physical Exam Vitals and nursing note reviewed.  HENT:     Head: Atraumatic.     Left Ear: Tympanic membrane normal.  Eyes:     Extraocular Movements: Extraocular movements intact.     Pupils: Pupils are equal, round, and reactive to light.     Comments: Eye movements intact.  States however she has pain with eye movements on left.  There is edema without erythema of the upper lid.  No drainage.  Pupil reactive.  Intraocular pressure appears normal.  No corneal uptake on fluorescein evaluation.  Cardiovascular:     Rate and Rhythm: Regular rhythm.   Pulmonary:     Effort: Pulmonary effort is normal.  Musculoskeletal:     Cervical back: Neck supple.     Comments: Mild paresthesia left hand.  Mild tenderness of the left shoulder.  Skin:    General: Skin is warm.     Capillary Refill: Capillary refill takes less than 2 seconds.  Neurological:     Mental Status: She is alert and oriented to person, place, and time.     Comments: Decreased vision in left eye.  Patient describes it as losing the color.  Eye movements intact without double vision.  Peripheral vision grossly intact.  Psychiatric:        Mood and Affect: Mood normal.     ED Results / Procedures / Treatments   Labs (all labs ordered are listed, but only abnormal results are displayed) Labs Reviewed  BASIC METABOLIC PANEL - Abnormal; Notable for the following components:      Result Value   Glucose, Bld 101 (*)    Calcium 8.8 (*)    All other components within normal limits  SARS CORONAVIRUS 2 (HOSPITAL ORDER, [place] [facility])  CBC  SEDIMENTATION RATE    EKG None  Radiology MR Brain W and Wo Contrast  Result Date: 12/15/2020 CLINICAL DATA:  Multiple sclerosis, left eye pressure, dizziness EXAM: MRI HEAD AND ORBITS WITHOUT AND WITH CONTRAST MRI CERVICAL SPINE WITHOUT AND WITH CONTRAST TECHNIQUE: Multiplanar, multiecho pulse sequences of the brain and surrounding structures were obtained without and with intravenous contrast. Multiplanar, multiecho pulse sequences of the orbits and surrounding structures were obtained including fat saturation techniques, before and after intravenous contrast administration. Multiplanar, multiecho pulse sequences of the cervical spine were obtained without and with intravenous contrast. CONTRAST:  33mL GADAVIST GADOBUTROL 1 MMOL/ML IV SOLN COMPARISON:  None. FINDINGS: MRI HEAD Brain: Minimal small foci of T2 hyperintensity in the central white matter. No acute infarction or intracranial hemorrhage. Ventricles and  sulci are normal in size and configuration. There is no intracranial mass, mass effect, edema, hydrocephalus, or extra-axial collection. No abnormal enhancement. Vascular: Major vessel flow voids at the skull base are preserved. Skull and upper cervical spine: Normal marrow signal. Other: Sella is unremarkable.  Mastoid air cells are clear. MRI ORBITS Orbits: There is no intraorbital mass. Globes, extraocular muscles, and lacrimal glands are symmetric and unremarkable. There is no abnormal enhancement of the optic nerve sheath  complexes. Visualized sinuses: Mild mucosal thickening Soft tissues: Negative. MRI CERVICAL SPINE Alignment: Physiologic. Vertebrae: Vertebral body heights are maintained. There is no marrow edema. There is likely an atypical vertebral body hemangioma at T1. Cord: Normal signal.  No abnormal enhancement. Intervertebral discs: Disc heights and signal are preserved. There is no disc herniation or stenosis at any level. IMPRESSION: Minimal foci of T2 hyperintensity in the cerebral white matter are nonspecific but may reflect chronic demyelination given reported history of multiple sclerosis. There is no evidence of optic neuritis or active demyelination. No abnormal cord signal or enhancement. Electronically Signed   By: Guadlupe Spanish M.D.   On: 12/15/2020 11:54   MR CERVICAL SPINE W WO CONTRAST  Result Date: 12/15/2020 CLINICAL DATA:  Multiple sclerosis, left eye pressure, dizziness EXAM: MRI HEAD AND ORBITS WITHOUT AND WITH CONTRAST MRI CERVICAL SPINE WITHOUT AND WITH CONTRAST TECHNIQUE: Multiplanar, multiecho pulse sequences of the brain and surrounding structures were obtained without and with intravenous contrast. Multiplanar, multiecho pulse sequences of the orbits and surrounding structures were obtained including fat saturation techniques, before and after intravenous contrast administration. Multiplanar, multiecho pulse sequences of the cervical spine were obtained without and with  intravenous contrast. CONTRAST:  33mL GADAVIST GADOBUTROL 1 MMOL/ML IV SOLN COMPARISON:  None. FINDINGS: MRI HEAD Brain: Minimal small foci of T2 hyperintensity in the central white matter. No acute infarction or intracranial hemorrhage. Ventricles and sulci are normal in size and configuration. There is no intracranial mass, mass effect, edema, hydrocephalus, or extra-axial collection. No abnormal enhancement. Vascular: Major vessel flow voids at the skull base are preserved. Skull and upper cervical spine: Normal marrow signal. Other: Sella is unremarkable.  Mastoid air cells are clear. MRI ORBITS Orbits: There is no intraorbital mass. Globes, extraocular muscles, and lacrimal glands are symmetric and unremarkable. There is no abnormal enhancement of the optic nerve sheath complexes. Visualized sinuses: Mild mucosal thickening Soft tissues: Negative. MRI CERVICAL SPINE Alignment: Physiologic. Vertebrae: Vertebral body heights are maintained. There is no marrow edema. There is likely an atypical vertebral body hemangioma at T1. Cord: Normal signal.  No abnormal enhancement. Intervertebral discs: Disc heights and signal are preserved. There is no disc herniation or stenosis at any level. IMPRESSION: Minimal foci of T2 hyperintensity in the cerebral white matter are nonspecific but may reflect chronic demyelination given reported history of multiple sclerosis. There is no evidence of optic neuritis or active demyelination. No abnormal cord signal or enhancement. Electronically Signed   By: Guadlupe Spanish M.D.   On: 12/15/2020 11:54   MR ORBITS W WO CONTRAST  Result Date: 12/15/2020 CLINICAL DATA:  Multiple sclerosis, left eye pressure, dizziness EXAM: MRI HEAD AND ORBITS WITHOUT AND WITH CONTRAST MRI CERVICAL SPINE WITHOUT AND WITH CONTRAST TECHNIQUE: Multiplanar, multiecho pulse sequences of the brain and surrounding structures were obtained without and with intravenous contrast. Multiplanar, multiecho pulse  sequences of the orbits and surrounding structures were obtained including fat saturation techniques, before and after intravenous contrast administration. Multiplanar, multiecho pulse sequences of the cervical spine were obtained without and with intravenous contrast. CONTRAST:  48mL GADAVIST GADOBUTROL 1 MMOL/ML IV SOLN COMPARISON:  None. FINDINGS: MRI HEAD Brain: Minimal small foci of T2 hyperintensity in the central white matter. No acute infarction or intracranial hemorrhage. Ventricles and sulci are normal in size and configuration. There is no intracranial mass, mass effect, edema, hydrocephalus, or extra-axial collection. No abnormal enhancement. Vascular: Major vessel flow voids at the skull base are preserved. Skull and upper cervical spine:  Normal marrow signal. Other: Sella is unremarkable.  Mastoid air cells are clear. MRI ORBITS Orbits: There is no intraorbital mass. Globes, extraocular muscles, and lacrimal glands are symmetric and unremarkable. There is no abnormal enhancement of the optic nerve sheath complexes. Visualized sinuses: Mild mucosal thickening Soft tissues: Negative. MRI CERVICAL SPINE Alignment: Physiologic. Vertebrae: Vertebral body heights are maintained. There is no marrow edema. There is likely an atypical vertebral body hemangioma at T1. Cord: Normal signal.  No abnormal enhancement. Intervertebral discs: Disc heights and signal are preserved. There is no disc herniation or stenosis at any level. IMPRESSION: Minimal foci of T2 hyperintensity in the cerebral white matter are nonspecific but may reflect chronic demyelination given reported history of multiple sclerosis. There is no evidence of optic neuritis or active demyelination. No abnormal cord signal or enhancement. Electronically Signed   By: Macy Mis M.D.   On: 12/15/2020 11:54    Procedures Procedures (including critical care time)  Medications Ordered in ED Medications  tetracaine (PONTOCAINE) 0.5 % ophthalmic  solution 2 drop (2 drops Left Eye Given by Other 12/15/20 [id number])  fluorescein ophthalmic strip 1 strip (1 strip Left Eye Given by Other 12/15/20 0832)  gadobutrol (GADAVIST) 1 MMOL/ML injection 7 mL (7 mLs Intravenous Contrast Given 12/15/20 1100)    ED Course  I have reviewed the triage vital signs and the nursing notes.  Pertinent labs & imaging results that were available during my care of the patient were reviewed by me and considered in my medical decision making (see chart for details).    MDM Rules/Calculators/A&P                          Patient with eye pain.  Previous history of MS.  Decreased vision in left eye.  Began yesterday.  Mild swelling of the lid but no erythema or drainage.  Decreased vision mostly color left eye and some photophobia.  Mild pain with movement of the eye.  MRI done with and without contrast of head orbits and cervical spine.  Overall reassuring without acute MS lesions.  Discussed with Dr. Midge Aver, who can see in follow-up tomorrow in his ophthalmology office.  Can show up anytime after 8:00.  Also patient will need to follow-up with Dr. Felecia Shelling, her neurologist to manage the MS. Final Clinical Impression(s) / ED Diagnoses Final diagnoses:  Vision loss of left eye    Rx / DC Orders ED Discharge Orders    None       Davonna Belling, MD 12/15/20 1546

## 2020-12-17 ENCOUNTER — Ambulatory Visit (HOSPITAL_COMMUNITY): Payer: Medicaid Other

## 2020-12-17 ENCOUNTER — Encounter (HOSPITAL_COMMUNITY): Payer: Self-pay

## 2020-12-17 ENCOUNTER — Ambulatory Visit (HOSPITAL_COMMUNITY): Admission: RE | Admit: 2020-12-17 | Payer: Medicaid Other | Source: Ambulatory Visit

## 2020-12-28 ENCOUNTER — Ambulatory Visit: Payer: Medicaid Other | Admitting: Neurology

## 2020-12-28 ENCOUNTER — Encounter: Payer: Self-pay | Admitting: Neurology

## 2020-12-28 ENCOUNTER — Telehealth: Payer: Self-pay | Admitting: *Deleted

## 2020-12-28 VITALS — BP 103/56 | HR 69 | Ht 63.0 in | Wt 146.0 lb

## 2020-12-28 DIAGNOSIS — G43709 Chronic migraine without aura, not intractable, without status migrainosus: Secondary | ICD-10-CM | POA: Diagnosis not present

## 2020-12-28 DIAGNOSIS — R200 Anesthesia of skin: Secondary | ICD-10-CM

## 2020-12-28 DIAGNOSIS — R9082 White matter disease, unspecified: Secondary | ICD-10-CM | POA: Diagnosis not present

## 2020-12-28 DIAGNOSIS — H539 Unspecified visual disturbance: Secondary | ICD-10-CM | POA: Diagnosis not present

## 2020-12-28 MED ORDER — RIZATRIPTAN BENZOATE 10 MG PO TABS: 10.0000 mg | ORAL_TABLET | ORAL | 5 refills | Status: DC | PRN

## 2020-12-28 MED ORDER — EMGALITY 120 MG/ML ~~LOC~~ SOAJ: 1.0000 "pen " | SUBCUTANEOUS | 4 refills | Status: DC

## 2020-12-28 NOTE — Telephone Encounter (Signed)
Submitted PA emgality on CMM. HFG:[id number]. Received instant approval: "PA Case: [id number], Status: Approved, Coverage Starts on: 12/28/2020 12:00:00 AM, Coverage Ends on: 03/28/2021 12:00:00 AM."

## 2020-12-28 NOTE — Progress Notes (Signed)
[facility]  PATIENT: Leah Burns DOB: 03/01/1991  REFERRING DOCTOR OR PCP: Dr. Sabra Heck (neurology); Salem Caster (PCP) SOURCE: Patient, notes from Dr. Sabra Heck, imaging reports, MRI images personally reviewed.  _________________________________   HISTORICAL  CHIEF COMPLAINT:  Chief Complaint  Patient presents with  . New Patient (Initial Visit)    RM 13, alone. Paper referral from Laurena Slimmer, MD for MS [facility]). Saw him last 11/24/20. Went to [facility] 12/15/20 (headache, vision changes in left eye/swelling, numb left arm). Saw Dr. Katy Fitch on 12/16/20? Dx with MS in 2014. Has seen Dr. Lavone Neri in [place], [place] in the past. Has tried: Rogelia Rohrer (stopped wanting to try herbal route instead) Has tried amitriptyline, gabapentin.  Still having numb in left hand/side that is intermittent. Denies any vision changes.    HISTORY OF PRESENT ILLNESS:  I had the pleasure of seeing patient, Leah Burns, at the [place] [facility] for neurologic consultation regarding her MS and recent optic neuritis.  She is a 30 year old woman who was diagnosed with MS in 2014 and recently had vision changes on the left.   In 2014, she had left sided weakness and had bladder and bowel incontinence.   She also had visual changes and she states she was diagnosed with ON.   She had MRI's and then was diagnosed with multiple sclerosis.  We do not have details of those images.  She began to see Dr. Lavone Neri, a neurologist at [facility] (now retired).  She was on Aubagio for about a year but stopped as she preferred to try a herbal route.    She has had fluctuating numbness on the left butno new neurologic symptoms until a couple weeks ago.  An MRI report from 2016 was read as being unremarkable.  She has also had fatigue.  She also reports having optic neuritis again in 2017.     She had some new symptoms last moth.   She noted left arm numbness and weakness.,   She had the onset of left  visual changes and also noted left ptosis and had trouble opening her eye.  Of note, she had a bad headache at that time.      She saw Dr. Katy Fitch and she reports there was no evidence of optic neuriitis.  Due to the symptoms, she had imaging studies.  I personally reviewed the MRI of the brain, orbits and cervical spine performed 12/15/2020, there are some nonspecific foci in the hemispheres.  There was no in enhancement of the optic nerves.  The spinal cord appears normal.  Currently, she feels back to baseline.  Vision is fine and she has no ptosis.   She notes some fluctuating nukmbness and weakness in her left arm, similar to the past few years.   She has mild stable urinary frequency/urgency but had one episode of incontinence with little warning.     She has headaches daily.    They are bifrontal and left > right temporally and orten bioccipital..   When the headache is present she nausea, rare vomiting, photophobia and phonophobia.   Moving her head worsens the intensity.   When a migraine occurs she rests and takes ondansetron.    She was placed on amitriptyline, nortriptyline, NSAIDs and gabapentin in the past for her headaches but these never helped.  She had been on topiramate but does not remember if it helped.   She had been on sumatriptan in the past without much benefit.     Imaging review:  MRI of the brain 12/25/2014 (images not available, performed at [facility] in [place]) was read as unremarkable  MRI of the brain 12/15/2020 showed a few T2/FLAIR hyperintense foci in the hemispheres in a nonspecific pattern.  MRI of the orbits 12/15/2020 showed no enhancement of the optic nerves.  MRI of the cervical spine 12/15/2020 was normal  REVIEW OF SYSTEMS: Constitutional: No fevers, chills, sweats, or change in appetite Eyes: No visual changes, double vision, eye pain Ear, nose and throat: No hearing loss, ear pain, nasal congestion, sore throat Cardiovascular: No chest pain,  palpitations Respiratory: No shortness of breath at rest or with exertion.   No wheezes GastrointestinaI: No nausea, vomiting, diarrhea, abdominal pain, fecal incontinence Genitourinary: No dysuria, urinary retention or frequency.  No nocturia. Musculoskeletal: No neck pain, back pain Integumentary: No rash, pruritus, skin lesions Neurological: as above Psychiatric: No depression at this time.  No anxiety Endocrine: No palpitations, diaphoresis, change in appetite, change in weigh or increased thirst Hematologic/Lymphatic: No anemia, purpura, petechiae. Allergic/Immunologic: No itchy/runny eyes, nasal congestion, recent allergic reactions, rashes  ALLERGIES: No Known Allergies  HOME MEDICATIONS:  Current Outpatient Medications:  .  albuterol (PROVENTIL HFA;VENTOLIN HFA) 108 (90 BASE) MCG/ACT inhaler, Inhale 1-2 puffs into the lungs every 6 (six) hours as needed for wheezing or shortness of breath., Disp: 1 Inhaler, Rfl: 0 .  Galcanezumab-gnlm (EMGALITY) 120 MG/ML SOAJ, Inject 1 pen into the skin every 28 (twenty-eight) days., Disp: 3 mL, Rfl: 4 .  ondansetron (ZOFRAN) 4 MG tablet, Take 1 tablet (4 mg total) by mouth every 8 (eight) hours as needed for nausea or vomiting., Disp: 20 tablet, Rfl: 0 .  rizatriptan (MAXALT) 10 MG tablet, Take 1 tablet (10 mg total) by mouth as needed for migraine. May repeat in 2 hours if needed, Disp: 10 tablet, Rfl: 5  PAST MEDICAL HISTORY: Past Medical History:  Diagnosis Date  . Asthma   . Endometriosis   . MS (multiple sclerosis) ([place])     PAST SURGICAL HISTORY: Past Surgical History:  Procedure Laterality Date  . ABDOMINAL HYSTERECTOMY     partial  . BREAST REDUCTION SURGERY Bilateral 09/14/2020   Procedure: BILATERAL MAMMARY REDUCTION  (BREAST);  Surgeon: Cindra Presume, MD;  Location: [facility];  Service: Plastics;  Laterality: Bilateral;  . DILATION AND EVACUATION      FAMILY HISTORY: Family History  Problem Relation  Age of Onset  . Healthy Mother   . Macular degeneration Mother   . Hypertension Father   . High Cholesterol Father     SOCIAL HISTORY:  Social History   Socioeconomic History  . Marital status: Single    Spouse name: Not on file  . Number of children: Not on file  . Years of education: Not on file  . Highest education level: Not on file  Occupational History  . Not on file  Tobacco Use  . Smoking status: Never Smoker  . Smokeless tobacco: Never Used  Vaping Use  . Vaping Use: Never used  Substance and Sexual Activity  . Alcohol use: Yes    Alcohol/week: 1.0 standard drink    Types: 1 Glasses of wine per week    Comment: occas-wine  . Drug use: Never  . Sexual activity: Yes    Birth control/protection: Injection  Other Topics Concern  . Not on file  Social History Narrative   Tea- 2 times per week   Right handed   Social Determinants of Health   Financial Resource Strain:  Not on file  Food Insecurity: Not on file  Transportation Needs: Not on file  Physical Activity: Not on file  Stress: Not on file  Social Connections: Not on file  Intimate Partner Violence: Not on file     PHYSICAL EXAM  Vitals:   12/28/20 1311  BP: (!) 103/56  Pulse: 69  Weight: 146 lb (66.2 kg)  Height: 5\' 3"  (1.6 m)    Body mass index is 25.86 kg/m.    Hearing Screening   125Hz  250Hz  500Hz  1000Hz  2000Hz  3000Hz  4000Hz  6000Hz  8000Hz   Right ear:           Left ear:             Visual Acuity Screening   Right eye Left eye Both eyes  Without correction:     With correction: 20/30 20/30 20/20       General: The patient is well-developed and well-nourished and in no acute distress  HEENT:  Head is [place]/AT.  Sclera are anicteric.  Funduscopic exam shows normal optic discs and retinal vessels.  Neck: No carotid bruits are noted.  The neck is nontender.  Cardiovascular: The heart has a regular rate and rhythm with a normal S1 and S2. There were no murmurs, gallops or rubs.     Skin: Extremities are without rash or  edema.  Musculoskeletal:  Back is nontender  Neurologic Exam  Mental status: The patient is alert and oriented x 3 at the time of the examination. The patient has apparent normal recent and remote memory, with an apparently normal attention span and concentration ability.   Speech is normal.  Cranial nerves: Extraocular movements are full. Pupils are equal, round, and reactive to light and accomodation.  Color vision was normal.  Visual fields are full.  Facial symmetry is present. There is good facial sensation to soft touch bilaterally.Facial strength is normal.  Trapezius and sternocleidomastoid strength is normal. No dysarthria is noted.  The tongue is midline, and the patient has symmetric elevation of the soft palate. No obvious hearing deficits are noted.  Motor:  Muscle bulk is normal.   Tone is normal. Strength is  5 / 5 in all 4 extremities.   Sensory: Sensory testing is intact to pinprick, soft touch and vibration sensation in all 4 extremities.  Coordination: Cerebellar testing reveals good finger-nose-finger and heel-to-shin bilaterally.  Gait and station: Station is normal.   Gait is normal. Tandem gait is normal. Romberg is negative.   Reflexes: Deep tendon reflexes are symmetric and normal bilaterally.   Plantar responses are flexor.    DIAGNOSTIC DATA (LABS, IMAGING, TESTING) - I reviewed patient records, labs, notes, testing and imaging myself where available.  Lab Results  Component Value Date   WBC 7.4 12/15/2020   HGB 12.9 12/15/2020   HCT 38.8 12/15/2020   MCV 86.4 12/15/2020   PLT 167 12/15/2020      Component Value Date/Time   NA 139 12/15/2020 0833   K 3.9 12/15/2020 0833   CL 110 12/15/2020 0833   CO2 22 12/15/2020 0833   GLUCOSE 101 (H) 12/15/2020 0833   BUN 14 12/15/2020 0833   CREATININE 0.93 12/15/2020 0833   CALCIUM 8.8 (L) 12/15/2020 0833   GFRNONAA >60 12/15/2020 02/12/2021       ASSESSMENT AND  PLAN  Chronic migraine w/o aura w/o status migrainosus, not intractable  Left arm numbness  Visual disturbance  White matter abnormality on MRI of brain   In summary, Leah Burns is a  30 year old woman who was diagnosed with MS in 2014 and more recently has had visual symptoms and left arm numbness and weakness.  She feels back to baseline.  I personally reviewed her recent brain and spine imaging.  The MRI of the brain does show some T2/flair hyperintense foci in the deep white matter but these are nonspecific.  She does not have any classic periventricular lesions, juxtacortical lesions or infratentorial lesions.  The MRI of the orbits did not show any optic neuritis.  The MRI of the spine was normal also.  I discussed with her that it is probable that she does not have multiple sclerosis.  Although the MRI of the brain was abnormal, the lesions are nonspecific and she has no definite evidence of demyelination.  Additionally, she has a normal examination.  It would be very unusual to go 8 years off of a disease modifying therapy and have so few lesions if this was MS.  Therefore, I will hold off initiating a disease modifying therapy.  In about a year, we could repeat the MRI scan to determine if there has been any subclinical activity.  She does have daily headache with migrainous features.  She has not responded to oral medications including tricyclic antidepressants, gabapentin or topiramate.  I gave her a sample for the initial month of Emgality and we will send in a prescription.  Hopefully this will help the headaches much more than any of the oral medications did.  I also sent in a prescription for Maxalt to try for breakthrough headaches.  She will return to see [place] in 3 months or sooner if there are new or worsening neurologic symptoms.  Carlis Burnsworth A. Epimenio Foot, MD, [facility] 12/28/2020, 3:50 PM Certified in Neurology, Clinical Neurophysiology, Sleep Medicine and Neuroimaging  [facility] [address] [place], [place] [address] [phone]

## 2021-02-22 ENCOUNTER — Telehealth: Payer: Self-pay | Admitting: Neurology

## 2021-02-22 NOTE — Telephone Encounter (Signed)
Pt asking for a call from RN to discuss her double vision and her Optic Neuritis(she was informed by her Ophthalmologist that she has Optic Neuritis) .  Pt wanting a call to know what to do about pain and double vision

## 2021-02-22 NOTE — Telephone Encounter (Addendum)
Called pt back. Dr. Felecia Shelling would like to see her. Scheduled work in appt for 2p tomorrow w/ MD. Asked she check in by 130p/145pm. She verbalized understanding and appreciation.  [facility] [facility] at [phone]. LVM for office notes from today's visit be faxed to [place] at [phone].

## 2021-02-22 NOTE — Telephone Encounter (Signed)
Called pt back. She started having constant double vision/pain 2-3 days ago. Just saw Dr. Warden Fillers today. He told her she has optic neuritis and advised her to call our office. He told her he would also reach out. Confirmed she is still taking emality once a month but she feels it is ineffective. Advised I will speak with MD and call her back.

## 2021-02-23 ENCOUNTER — Ambulatory Visit: Payer: Medicaid Other | Admitting: Plastic Surgery

## 2021-02-23 ENCOUNTER — Ambulatory Visit: Payer: Self-pay | Admitting: Neurology

## 2021-04-07 ENCOUNTER — Emergency Department (HOSPITAL_COMMUNITY)
Admission: EM | Admit: 2021-04-07 | Discharge: 2021-04-07 | Disposition: A | Discharge status: Home / Self Care | Payer: Medicaid Other | Attending: Emergency Medicine | Admitting: Emergency Medicine

## 2021-04-07 ENCOUNTER — Other Ambulatory Visit: Payer: Self-pay

## 2021-04-07 ENCOUNTER — Encounter (HOSPITAL_COMMUNITY): Payer: Self-pay

## 2021-04-07 ENCOUNTER — Emergency Department (HOSPITAL_COMMUNITY)
Admission: EM | Admit: 2021-04-07 | Discharge: 2021-04-07 | Disposition: A | Discharge status: Home / Self Care | Payer: Medicaid Other | Source: Home / Self Care | Attending: Emergency Medicine | Admitting: Emergency Medicine

## 2021-04-07 DIAGNOSIS — Z5321 Procedure and treatment not carried out due to patient leaving prior to being seen by health care provider: Secondary | ICD-10-CM | POA: Insufficient documentation

## 2021-04-07 DIAGNOSIS — M545 Low back pain, unspecified: Secondary | ICD-10-CM | POA: Insufficient documentation

## 2021-04-07 DIAGNOSIS — J45909 Unspecified asthma, uncomplicated: Secondary | ICD-10-CM | POA: Diagnosis not present

## 2021-04-07 DIAGNOSIS — R202 Paresthesia of skin: Secondary | ICD-10-CM | POA: Insufficient documentation

## 2021-04-07 DIAGNOSIS — M5441 Lumbago with sciatica, right side: Secondary | ICD-10-CM

## 2021-04-07 LAB — CBC WITH DIFFERENTIAL/PLATELET
Abs Immature Granulocytes: 0.03 10*3/uL (ref 0.00–0.07)
Basophils Absolute: 0 10*3/uL (ref 0.0–0.1)
Basophils Relative: 0 %
Eosinophils Absolute: 0 10*3/uL (ref 0.0–0.5)
Eosinophils Relative: 0 %
HCT: 40.1 % (ref 36.0–46.0)
Hemoglobin: 13.3 g/dL (ref 12.0–15.0)
Immature Granulocytes: 0 %
Lymphocytes Relative: 3 %
Lymphs Abs: 0.2 10*3/uL — ABNORMAL LOW (ref 0.7–4.0)
MCH: 27.8 pg (ref 26.0–34.0)
MCHC: 33.2 g/dL (ref 30.0–36.0)
MCV: 83.9 fL (ref 80.0–100.0)
Monocytes Absolute: 0.7 10*3/uL (ref 0.1–1.0)
Monocytes Relative: 8 %
Neutro Abs: 8 10*3/uL — ABNORMAL HIGH (ref 1.7–7.7)
Neutrophils Relative %: 89 %
Platelets: 164 10*3/uL (ref 150–400)
RBC: 4.78 MIL/uL (ref 3.87–5.11)
RDW: 13.6 % (ref 11.5–15.5)
WBC: 9 10*3/uL (ref 4.0–10.5)
nRBC: 0 % (ref 0.0–0.2)

## 2021-04-07 LAB — COMPREHENSIVE METABOLIC PANEL
ALT: 12 U/L (ref 0–44)
AST: 21 U/L (ref 15–41)
Albumin: 5.4 g/dL — ABNORMAL HIGH (ref 3.5–5.0)
Alkaline Phosphatase: 37 U/L — ABNORMAL LOW (ref 38–126)
Anion gap: 10 (ref 5–15)
BUN: 12 mg/dL (ref 6–20)
CO2: 21 mmol/L — ABNORMAL LOW (ref 22–32)
Calcium: 9.9 mg/dL (ref 8.9–10.3)
Chloride: 104 mmol/L (ref 98–111)
Creatinine, Ser: 0.95 mg/dL (ref 0.44–1.00)
GFR, Estimated: >60 mL/min (ref >60)
Glucose, Bld: 93 mg/dL (ref 70–99)
Potassium: 3.5 mmol/L (ref 3.5–5.1)
Sodium: 135 mmol/L (ref 135–145)
Total Bilirubin: 0.9 mg/dL (ref 0.3–1.2)
Total Protein: 8.1 g/dL (ref 6.5–8.1)

## 2021-04-07 LAB — URINALYSIS, ROUTINE W REFLEX MICROSCOPIC
Bilirubin Urine: NEGATIVE
Glucose, UA: NEGATIVE mg/dL
Ketones, ur: 20 mg/dL — AB
Nitrite: NEGATIVE
Protein, ur: NEGATIVE mg/dL
Specific Gravity, Urine: 1.005 (ref 1.005–1.030)
pH: 7 (ref 5.0–8.0)

## 2021-04-07 LAB — I-STAT BETA HCG BLOOD, ED (MC, WL, AP ONLY): I-stat hCG, quantitative: <5 m[IU]/mL (ref <5)

## 2021-04-07 MED ORDER — ONDANSETRON HCL 4 MG/2ML IJ SOLN
4.0000 mg | Freq: Once | INTRAMUSCULAR | Status: AC
  Administered 2021-04-07: 4 mg via INTRAVENOUS
  Filled 2021-04-07: qty 2

## 2021-04-07 MED ORDER — KETOROLAC TROMETHAMINE 30 MG/ML IJ SOLN
15.0000 mg | Freq: Once | INTRAMUSCULAR | Status: AC
  Administered 2021-04-07: 15 mg via INTRAVENOUS
  Filled 2021-04-07: qty 1

## 2021-04-07 MED ORDER — PREDNISONE 10 MG PO TABS: ORAL_TABLET | ORAL | 0 refills | Status: AC

## 2021-04-07 MED ORDER — LACTATED RINGERS IV BOLUS
1000.0000 mL | Freq: Once | INTRAVENOUS | Status: AC
  Administered 2021-04-07: 1000 mL via INTRAVENOUS

## 2021-04-07 MED ORDER — LIDOCAINE 5 % EX PTCH: 1.0000 | MEDICATED_PATCH | CUTANEOUS | 0 refills | Status: DC

## 2021-04-07 MED ORDER — METHOCARBAMOL 500 MG PO TABS: 500.0000 mg | ORAL_TABLET | Freq: Two times a day (BID) | ORAL | 0 refills | Status: DC

## 2021-04-07 MED ORDER — METHYLPREDNISOLONE SODIUM SUCC 125 MG IJ SOLR
125.0000 mg | Freq: Once | INTRAMUSCULAR | Status: AC
  Administered 2021-04-07: 125 mg via INTRAVENOUS
  Filled 2021-04-07: qty 2

## 2021-04-07 NOTE — Discharge Instructions (Addendum)
  Take it easy, but do not lay around too much as this may make any stiffness worse.  Antiinflammatory medications: Take 600 mg of ibuprofen every 6 hours or 440 mg (over the counter dose) to 500 mg (prescription dose) of naproxen every 12 hours for the next 3 days. After this time, these medications may be used as needed for pain. Take these medications with food to avoid upset stomach. Choose only one of these medications, do not take them together. Acetaminophen (generic for Tylenol): Should you continue to have additional pain while taking the ibuprofen or naproxen, you may add in acetaminophen as needed. Your daily total maximum amount of acetaminophen from all sources should be limited to 4000mg /day for persons without liver problems, or 2000mg /day for those with liver problems. Methocarbamol: Methocarbamol (generic for Robaxin) is a muscle relaxer and can help relieve stiff muscles or muscle spasms.  Do not drive or perform other dangerous activities while taking this medication as it can cause drowsiness as well as changes in reaction time and judgement. Prednisone: Take the prednisone, as prescribed, until finished. If you are a diabetic, please know prednisone can raise your blood sugar temporarily. Lidocaine patches: These are available via either prescription or over-the-counter. The over-the-counter option may be more economical one and are likely just as effective. There are multiple over-the-counter brands, such as Salonpas. Ice: May apply ice to the area over the next 24 hours for 15 minutes at a time to reduce pain, inflammation, and swelling, if present. Exercises: Be sure to perform the attached exercises starting with three times a week and working up to performing them daily. This is an essential part of preventing long term problems.  Follow up: Follow up with a primary care provider and your neurologist for any future management of these complaints. Be sure to follow up within 7-10  days. Return: Return to the ED should symptoms worsen.  For prescription assistance, may try using prescription discount sites or apps, such as goodrx.com

## 2021-04-07 NOTE — ED Provider Notes (Addendum)
[place] DEPT Provider Note   CSN: [id number] Arrival date & time: 04/07/21  1143     History Chief Complaint  Patient presents with  . Back Pain    Leah Burns is a 30 y.o. female.  HPI      Leah Burns is a 30 y.o. female, with a history of asthma, MS, presenting to the ED with lower back pain beginning this morning. Pain is aching, severe, midline and right lower back, radiating down the right leg.  This is accompanied by tingling in the right leg as well. Denies IV drug use, HIV. She denies fever, abdominal pain, numbness, weakness, falls/trauma, saddle anesthesias, changes in bowel or bladder function, vision changes, headache, difficulty swallowing, or any other complaints.     Past Medical History:  Diagnosis Date  . Asthma   . Endometriosis   . MS (multiple sclerosis) [facility]     Patient Active Problem List   Diagnosis Date Noted  . Chronic migraine w/o aura w/o status migrainosus, not intractable 12/28/2020  . Left arm numbness 12/28/2020  . Visual disturbance 12/28/2020  . White matter abnormality on MRI of brain 12/28/2020  . S/P bilateral breast reduction 10/04/2020    Past Surgical History:  Procedure Laterality Date  . ABDOMINAL HYSTERECTOMY     partial  . BREAST REDUCTION SURGERY Bilateral 09/14/2020   Procedure: BILATERAL MAMMARY REDUCTION  (BREAST);  Surgeon: Cindra Presume, MD;  Location: [place];  Service: Plastics;  Laterality: Bilateral;  . DILATION AND EVACUATION       OB History    Gravida  2   Para  2   Term  2   Preterm      AB      Living  2     SAB      IAB      Ectopic      Multiple      Live Births              Family History  Problem Relation Age of Onset  . Healthy Mother   . Macular degeneration Mother   . Hypertension Father   . High Cholesterol Father     Social History   Tobacco Use  . Smoking status: Never Smoker  . Smokeless  tobacco: Never Used  Vaping Use  . Vaping Use: Never used  Substance Use Topics  . Alcohol use: Yes    Alcohol/week: 1.0 standard drink    Types: 1 Glasses of wine per week    Comment: occas-wine  . Drug use: Never    Home Medications Prior to Admission medications   Medication Sig Start Date End Date Taking? Authorizing Provider  lidocaine (LIDODERM) 5 % Place 1 patch onto the skin daily. Remove & Discard patch within 12 hours or as directed by MD 04/07/21  Yes Quade Ramirez C, PA-C  methocarbamol (ROBAXIN) 500 MG tablet Take 1 tablet (500 mg total) by mouth 2 (two) times daily. 04/07/21  Yes Dorothea Yow C, PA-C  predniSONE (DELTASONE) 10 MG tablet Take 4 tablets (40 mg total) by mouth daily for 5 days, THEN 3 tablets (30 mg total) daily for 1 day, THEN 2 tablets (20 mg total) daily for 1 day, THEN 1 tablet (10 mg total) daily for 1 day. 04/07/21 04/15/21 Yes Kileigh Ortmann C, PA-C  albuterol (PROVENTIL HFA;VENTOLIN HFA) 108 (90 BASE) MCG/ACT inhaler Inhale 1-2 puffs into the lungs every 6 (six) hours as needed for wheezing  or shortness of breath. 04/24/15   Jaclyn Prime, Collene Leyden, PA-C  doxepin (SINEQUAN) 10 MG capsule Take 10 mg by mouth at bedtime. 02/22/21   [provider]  famotidine (PEPCID) 40 MG tablet Take 40 mg by mouth daily. 02/22/21   [provider]  Galcanezumab-gnlm (EMGALITY) 120 MG/ML SOAJ Inject 1 pen into the skin every 28 (twenty-eight) days. 12/28/20   Sater, Nanine Means, MD  omeprazole (PRILOSEC) 40 MG capsule Take 40 mg by mouth 2 (two) times daily. 02/22/21   [provider]  ondansetron (ZOFRAN) 4 MG tablet Take 1 tablet (4 mg total) by mouth every 8 (eight) hours as needed for nausea or vomiting. 09/02/20   Scheeler, Carola Rhine, PA-C  rizatriptan (MAXALT) 10 MG tablet Take 1 tablet (10 mg total) by mouth as needed for migraine. May repeat in 2 hours if needed 12/28/20   Sater, Nanine Means, MD    Allergies    Patient has no known allergies.  Review of Systems    Review of Systems  Constitutional: Negative for chills and fever.  HENT: Negative for trouble swallowing and voice change.   Eyes: Negative for visual disturbance.  Respiratory: Negative for shortness of breath.   Cardiovascular: Negative for chest pain.  Gastrointestinal: Negative for abdominal pain, constipation, diarrhea, nausea and vomiting.  Genitourinary: Negative for difficulty urinating.  Musculoskeletal: Positive for back pain.  Neurological: Negative for syncope, weakness, numbness and headaches.  All other systems reviewed and are negative.   Physical Exam Updated Vital Signs BP 125/76 (BP Location: Right Arm)   Pulse (!) 110   Resp 20   SpO2 100%   Physical Exam Vitals and nursing note reviewed. Exam conducted with a chaperone present.  Constitutional:      General: She is in acute distress (Appears uncomfortable).     Appearance: She is well-developed. She is not diaphoretic.  HENT:     Head: Normocephalic and atraumatic.     Mouth/Throat:     Mouth: Mucous membranes are moist.     Pharynx: Oropharynx is clear.  Eyes:     Conjunctiva/sclera: Conjunctivae normal.  Cardiovascular:     Rate and Rhythm: Normal rate and regular rhythm.     Pulses: Normal pulses.          Radial pulses are 2+ on the right side and 2+ on the left side.       Dorsalis pedis pulses are 2+ on the right side and 2+ on the left side.       Posterior tibial pulses are 2+ on the right side and 2+ on the left side.     Heart sounds: Normal heart sounds.     Comments: Tactile temperature in the extremities appropriate and equal bilaterally. Pulmonary:     Effort: Pulmonary effort is normal. No respiratory distress.     Breath sounds: Normal breath sounds.  Abdominal:     Palpations: Abdomen is soft.     Tenderness: There is no abdominal tenderness. There is no guarding.  Genitourinary:    Comments: Rectal tone intact as voiced by patient. Musculoskeletal:     Cervical back: Neck  supple.       Back:     Right lower leg: No edema.     Left lower leg: No edema.  Skin:    General: Skin is warm and dry.  Neurological:     Mental Status: She is alert.     Comments: No noted acute cognitive deficit. Sensation  grossly intact to light touch in the extremities.  She indicates paresthesias in the right lower extremity. Grip strengths equal bilaterally.   Strength 5/5 in all extremities.  Right lower extremity testing was able to be completed following pain management.  Strength with flexion and extension was 5/5 in the right ankle, knee, and hip. Coordination intact.  Cranial nerves III-XII grossly intact.  Handles oral secretions without noted difficulty.  No noted phonation or speech deficit. No facial droop.  Following treatment, patient was able to ambulate without gait deficit, instability, or need for assistance.  Psychiatric:        Mood and Affect: Mood is anxious. Affect is tearful.        Speech: Speech normal.        Behavior: Behavior normal.     ED Results / Procedures / Treatments   Labs (all labs ordered are listed, but only abnormal results are displayed) Labs Reviewed  COMPREHENSIVE METABOLIC PANEL - Abnormal; Notable for the following components:      Result Value   CO2 21 (*)    Albumin 5.4 (*)    Alkaline Phosphatase 37 (*)    All other components within normal limits  CBC WITH DIFFERENTIAL/PLATELET - Abnormal; Notable for the following components:   Neutro Abs 8.0 (*)    Lymphs Abs 0.2 (*)    All other components within normal limits  URINALYSIS, ROUTINE W REFLEX MICROSCOPIC - Abnormal; Notable for the following components:   Color, Urine STRAW (*)    Hgb urine dipstick MODERATE (*)    Ketones, ur 20 (*)    Leukocytes,Ua TRACE (*)    Bacteria, UA RARE (*)    All other components within normal limits  I-STAT BETA HCG BLOOD, ED ([facility], [facility], [facility] ONLY)    EKG None  Radiology No results found.  Procedures Procedures   Medications  Ordered in ED Medications  lactated ringers bolus 1,000 mL (0 mLs Intravenous Stopped 04/07/21 1453)  ketorolac (TORADOL) 30 MG/ML injection 15 mg (15 mg Intravenous Given 04/07/21 1255)  methylPREDNISolone sodium succinate (SOLU-MEDROL) 125 mg/2 mL injection 125 mg (125 mg Intravenous Given 04/07/21 1256)  ondansetron (ZOFRAN) injection 4 mg (4 mg Intravenous Given 04/07/21 1256)    ED Course  I have reviewed the triage vital signs and the nursing notes.  Pertinent labs & imaging results that were available during my care of the patient were reviewed by me and considered in my medical decision making (see chart for details).  Clinical Course as of 04/09/21 0735  Thu Apr 07, 2021  1335 Patient states she feels much better following the medications.  Her back pain has resolved.  Continues to endorse paresthesias in the right lower leg. [SJ]    Clinical Course User Index [SJ] Alda Gaultney, Helane Gunther, PA-C   MDM Rules/Calculators/A&P                          Patient presents with lower back pain radiating to the right lower extremity, nontraumatic in origin. Patient is nontoxic appearing, afebrile, not consistently tachycardic, not tachypneic, not hypotensive, maintains excellent SPO2 on room air.  I have reviewed the patient's chart to obtain more information.   I reviewed and interpreted the patient's labs. Patient's symptoms completely resolved with treatment here in the ED.  Her pain pattern involving the back and unilateral extremity I think it is more suggestive of sciatica rather than a central cord dysfunction. She does not present  with symptoms or findings suggestive of neurosurgical emergency, such as cauda equina. It should also be noted that the patient checked in initially, was placed in a room, and then left under her own power before returning and checking in again. She was advised to follow-up with her PCP versus neurologist.  The patient was given instructions for home care as well  as return precautions. Patient voices understanding of these instructions, accepts the plan, and is comfortable with discharge.  Findings and plan of care discussed with attending physician, Lennice Sites, DO.   Final Clinical Impression(s) / ED Diagnoses Final diagnoses:  Acute right-sided low back pain with right-sided sciatica    Rx / DC Orders ED Discharge Orders         Ordered    methocarbamol (ROBAXIN) 500 MG tablet  2 times daily        04/07/21 1506    predniSONE (DELTASONE) 10 MG tablet        04/07/21 1506    lidocaine (LIDODERM) 5 %  Every 24 hours        04/07/21 [place], Darlette Dubow C, PA-C 04/09/21 0741    Lorayne Bender, PA-C 04/20/21 2029    Lennice Sites, DO 04/23/21 0710

## 2021-04-07 NOTE — ED Notes (Signed)
This RN attempted to triage patient but patient reported she needed to leave immediately to pick up her daughter. Patient walked out of ED.

## 2021-04-07 NOTE — ED Notes (Signed)
Patient had to leave neighbor was supposed to bring her child up here and did not.  Patient called neighbor and they were not answering the phone.  Patient was crying.  I took IV out and patient left.

## 2021-04-07 NOTE — ED Triage Notes (Signed)
Pt reports extreme back pain that radiates down both legs. Pt was originally brought in by EMS earlier and received Fentanyl, but left due to child care issues. Pt has hx of MS.

## 2021-04-07 NOTE — ED Triage Notes (Signed)
Per EMS-history of MS-woke up this am with "extreme" body pain-did extra work yesterday and might have over did it-100 mcg of Fentanyl given in route-positive response from pain meds

## 2021-04-07 NOTE — ED Notes (Signed)
Pt ambulatory with assistance. 

## 2021-04-13 ENCOUNTER — Ambulatory Visit: Payer: Medicaid Other | Admitting: Family Medicine

## 2021-04-13 ENCOUNTER — Encounter: Payer: Self-pay | Admitting: Family Medicine

## 2021-04-13 NOTE — Progress Notes (Deleted)
No chief complaint on file.    HISTORY OF PRESENT ILLNESS: 04/13/21 ALL:  Leah Burns is a 30 y.o. female here today for follow up for headaches. She was diagnosed with MS in 2014, started on Aubagio for short period but stopped over 8 years ago. She was seen in consult by Dr Felecia Shelling 12/2020 following concerns of left arm numbness and weakness. MRI reviewed and did not demonstrate definitive evidence of demyelination. Nonspecific foci in deep white matter. No new lesions with comparison. MS thought to be less likely diagnosis. She was started on Emgality and rizatriptan for migraine management.   She was seen by the ER 04/07/21 for acute right sided back pain with right lower extremity numbness. Exam was normal and not felt to be concerning for MS flare. She was diagnosed with sciatica and started on steroid taper. She had follow up with PCP 4/29 who started gabapentin 300mg  BID for continued right leg numbness.   She admits to more depression. Prozac was not helping and recently switched to sertraline by PCP. She lost her 62 year old daughter in 2019 in a car accident.     HISTORY (copied from Dr Garth Bigness previous note)  I had the pleasure of seeing patient, Leah Burns, at the [place] [facility] for neurologic consultation regarding her MS and recent optic neuritis.  She is a 30 year old woman who was diagnosed with MS in 2014 and recently had vision changes on the left.   In 2014, she had left sided weakness and had bladder and bowel incontinence.   She also had visual changes and she states she was diagnosed with ON.   She had MRI's and then was diagnosed with multiple sclerosis.  We do not have details of those images.  She began to see Dr. Lavone Neri, a neurologist at [facility] (now retired).  She was on Aubagio for about a year but stopped as she preferred to try a herbal route.    She has had fluctuating numbness on the left butno new neurologic symptoms until a  couple weeks ago.  An MRI report from 2016 was read as being unremarkable.  She has also had fatigue.  She also reports having optic neuritis again in 2017.     She had some new symptoms last moth.   She noted left arm numbness and weakness.,   She had the onset of left visual changes and also noted left ptosis and had trouble opening her eye.  Of note, she had a bad headache at that time.      She saw Dr. Katy Fitch and she reports there was no evidence of optic neuriitis.  Due to the symptoms, she had imaging studies.  I personally reviewed the MRI of the brain, orbits and cervical spine performed 12/15/2020, there are some nonspecific foci in the hemispheres.  There was no in enhancement of the optic nerves.  The spinal cord appears normal.  Currently, she feels back to baseline.  Vision is fine and she has no ptosis.   She notes some fluctuating nukmbness and weakness in her left arm, similar to the past few years.   She has mild stable urinary frequency/urgency but had one episode of incontinence with little warning.     She has headaches daily.    They are bifrontal and left > right temporally and orten bioccipital..   When the headache is present she nausea, rare vomiting, photophobia and phonophobia.   Moving her head worsens the intensity.  When a migraine occurs she rests and takes ondansetron.    She was placed on amitriptyline, nortriptyline, NSAIDs and gabapentin in the past for her headaches but these never helped.  She had been on topiramate but does not remember if it helped.   She had been on sumatriptan in the past without much benefit.     Imaging review: MRI of the brain 12/25/2014 (images not available, performed at [facility] in [place]) was read as unremarkable  MRI of the brain 12/15/2020 showed a few T2/FLAIR hyperintense foci in the hemispheres in a nonspecific pattern.  MRI of the orbits 12/15/2020 showed no enhancement of the optic nerves.  MRI of the cervical spine 12/15/2020 was  normal   REVIEW OF SYSTEMS: Out of a complete 14 system review of symptoms, the patient complains only of the following symptoms, migraines, back pain, and all other reviewed systems are negative.    ALLERGIES: No Known Allergies   HOME MEDICATIONS: Outpatient Medications Prior to Visit  Medication Sig Dispense Refill  . albuterol (PROVENTIL HFA;VENTOLIN HFA) 108 (90 BASE) MCG/ACT inhaler Inhale 1-2 puffs into the lungs every 6 (six) hours as needed for wheezing or shortness of breath. 1 Inhaler 0  . doxepin (SINEQUAN) 10 MG capsule Take 10 mg by mouth at bedtime.    . famotidine (PEPCID) 40 MG tablet Take 40 mg by mouth daily.    . Galcanezumab-gnlm (EMGALITY) 120 MG/ML SOAJ Inject 1 pen into the skin every 28 (twenty-eight) days. 3 mL 4  . lidocaine (LIDODERM) 5 % Place 1 patch onto the skin daily. Remove & Discard patch within 12 hours or as directed by MD 30 patch 0  . methocarbamol (ROBAXIN) 500 MG tablet Take 1 tablet (500 mg total) by mouth 2 (two) times daily. 20 tablet 0  . omeprazole (PRILOSEC) 40 MG capsule Take 40 mg by mouth 2 (two) times daily.    . ondansetron (ZOFRAN) 4 MG tablet Take 1 tablet (4 mg total) by mouth every 8 (eight) hours as needed for nausea or vomiting. 20 tablet 0  . predniSONE (DELTASONE) 10 MG tablet Take 4 tablets (40 mg total) by mouth daily for 5 days, THEN 3 tablets (30 mg total) daily for 1 day, THEN 2 tablets (20 mg total) daily for 1 day, THEN 1 tablet (10 mg total) daily for 1 day. 26 tablet 0  . rizatriptan (MAXALT) 10 MG tablet Take 1 tablet (10 mg total) by mouth as needed for migraine. May repeat in 2 hours if needed 10 tablet 5   No facility-administered medications prior to visit.     PAST MEDICAL HISTORY: Past Medical History:  Diagnosis Date  . Asthma   . Endometriosis   . MS (multiple sclerosis) ([place])      PAST SURGICAL HISTORY: Past Surgical History:  Procedure Laterality Date  . ABDOMINAL HYSTERECTOMY     partial  .  BREAST REDUCTION SURGERY Bilateral 09/14/2020   Procedure: BILATERAL MAMMARY REDUCTION  (BREAST);  Surgeon: Cindra Presume, MD;  Location: [place];  Service: Plastics;  Laterality: Bilateral;  . DILATION AND EVACUATION       FAMILY HISTORY: Family History  Problem Relation Age of Onset  . Healthy Mother   . Macular degeneration Mother   . Hypertension Father   . High Cholesterol Father      SOCIAL HISTORY: Social History   Socioeconomic History  . Marital status: Single    Spouse name: Not on file  . Number of  children: Not on file  . Years of education: Not on file  . Highest education level: Not on file  Occupational History  . Not on file  Tobacco Use  . Smoking status: Never Smoker  . Smokeless tobacco: Never Used  Vaping Use  . Vaping Use: Never used  Substance and Sexual Activity  . Alcohol use: Yes    Alcohol/week: 1.0 standard drink    Types: 1 Glasses of wine per week    Comment: occas-wine  . Drug use: Never  . Sexual activity: Yes    Birth control/protection: Injection  Other Topics Concern  . Not on file  Social History Narrative   Tea- 2 times per week   Right handed   Social Determinants of Health   Financial Resource Strain: Not on file  Food Insecurity: Not on file  Transportation Needs: Not on file  Physical Activity: Not on file  Stress: Not on file  Social Connections: Not on file  Intimate Partner Violence: Not on file      PHYSICAL EXAM  There were no vitals filed for this visit. There is no height or weight on file to calculate BMI.   Generalized: Well developed, in no acute distress  Cardiology: normal rate and rhythm, no murmur auscultated  Respiratory: clear to auscultation bilaterally    Neurological examination  Mentation: Alert oriented to time, place, history taking. Follows all commands speech and language fluent Cranial nerve II-XII: Pupils were equal round reactive to light. Extraocular movements  were full, visual field were full on confrontational test. Facial sensation and strength were normal. Uvula tongue midline. Head turning and shoulder shrug  were normal and symmetric. Motor: The motor testing reveals 5 over 5 strength of all 4 extremities. Good symmetric motor tone is noted throughout.  Sensory: Sensory testing is intact to soft touch on all 4 extremities. No evidence of extinction is noted.  Coordination: Cerebellar testing reveals good finger-nose-finger and heel-to-shin bilaterally.  Gait and station: Gait is normal. Tandem gait is normal. Romberg is negative. No drift is seen.  Reflexes: Deep tendon reflexes are symmetric and normal bilaterally.     DIAGNOSTIC DATA (LABS, IMAGING, TESTING) - I reviewed patient records, labs, notes, testing and imaging myself where available.  Lab Results  Component Value Date   WBC 9.0 04/07/2021   HGB 13.3 04/07/2021   HCT 40.1 04/07/2021   MCV 83.9 04/07/2021   PLT 164 04/07/2021      Component Value Date/Time   NA 135 04/07/2021 1239   K 3.5 04/07/2021 1239   CL 104 04/07/2021 1239   CO2 21 (L) 04/07/2021 1239   GLUCOSE 93 04/07/2021 1239   BUN 12 04/07/2021 1239   CREATININE 0.95 04/07/2021 1239   CALCIUM 9.9 04/07/2021 1239   PROT 8.1 04/07/2021 1239   ALBUMIN 5.4 (H) 04/07/2021 1239   AST 21 04/07/2021 1239   ALT 12 04/07/2021 1239   ALKPHOS 37 (L) 04/07/2021 1239   BILITOT 0.9 04/07/2021 1239   GFRNONAA >60 04/07/2021 1239   No results found for: CHOL, HDL, LDLCALC, LDLDIRECT, TRIG, CHOLHDL No results found for: HGBA1C No results found for: VITAMINB12 No results found for: TSH  No flowsheet data found.   No flowsheet data found.   ASSESSMENT AND PLAN  30 y.o. year old female  has a past medical history of Asthma, Endometriosis, and MS (multiple sclerosis) ([place]). here with     White matter abnormality on MRI of brain  Chronic migraine w/o aura  w/o status migrainosus, not intractable  Acute  right-sided low back pain with right-sided sciatica    No orders of the defined types were placed in this encounter.    No orders of the defined types were placed in this encounter.     I spent 20 minutes of face-to-face and non-face-to-face time with patient.  This included previsit chart review, lab review, study review, order entry, electronic health record documentation, patient education.    Debbora Presto, MSN, FNP-C 04/13/2021, 12:42 PM  [facility] [address] [place], [place] [address] [phone]

## 2021-04-13 NOTE — Patient Instructions (Incomplete)
Below is our plan:  We will ***  Please make sure you are staying well hydrated. I recommend 50-60 ounces daily. Well balanced diet and regular exercise encouraged. Consistent sleep schedule with 6-8 hours recommended.   Please continue follow up with care team as directed.   Follow up with *** in ***  You may receive a survey regarding today's visit. I encourage you to leave honest feed back as I do use this information to improve patient care. Thank you for seeing me today!     Acute Back Pain, Adult Acute back pain is sudden and usually short-lived. It is often caused by an injury to the muscles and tissues in the back. The injury may result from:  A muscle or ligament getting overstretched or torn (strained). Ligaments are tissues that connect bones to each other. Lifting something improperly can cause a back strain.  Wear and tear (degeneration) of the spinal disks. Spinal disks are circular tissue that provide cushioning between the bones of the spine (vertebrae).  Twisting motions, such as while playing sports or doing yard work.  A hit to the back.  Arthritis. You may have a physical exam, lab tests, and imaging tests to find the cause of your pain. Acute back pain usually goes away with rest and home care. Follow these instructions at home: Managing pain, stiffness, and swelling  Treatment may include medicines for pain and inflammation that are taken by mouth or applied to the skin, prescription pain medicine, or muscle relaxants. Take over-the-counter and prescription medicines only as told by your health care provider.  Your health care provider may recommend applying ice during the first 24-48 hours after your pain starts. To do this: ? Put ice in a plastic bag. ? Place a towel between your skin and the bag. ? Leave the ice on for 20 minutes, 2-3 times a day.  If directed, apply heat to the affected area as often as told by your health care provider. Use the heat  source that your health care provider recommends, such as a moist heat pack or a heating pad. ? Place a towel between your skin and the heat source. ? Leave the heat on for 20-30 minutes. ? Remove the heat if your skin turns bright red. This is especially important if you are unable to feel pain, heat, or cold. You have a greater risk of getting burned. Activity  Do not stay in bed. Staying in bed for more than 1-2 days can delay your recovery.  Sit up and stand up straight. Avoid leaning forward when you sit or hunching over when you stand. ? If you work at a desk, sit close to it so you do not need to lean over. Keep your chin tucked in. Keep your neck drawn back, and keep your elbows bent at a 90-degree angle (right angle). ? Sit high and close to the steering wheel when you drive. Add lower back (lumbar) support to your car seat, if needed.  Take short walks on even surfaces as soon as you are able. Try to increase the length of time you walk each day.  Do not sit, drive, or stand in one place for more than 30 minutes at a time. Sitting or standing for long periods of time can put stress on your back.  Do not drive or use heavy machinery while taking prescription pain medicine.  Use proper lifting techniques. When you bend and lift, use positions that put less stress on your  back: ? [employer]. ? Keep the load close to your body. ? Avoid twisting.  Exercise regularly as told by your health care provider. Exercising helps your back heal faster and helps prevent back injuries by keeping muscles strong and flexible.  Work with a physical therapist to make a safe exercise program, as recommended by your health care provider. Do any exercises as told by your physical therapist.   Lifestyle  Maintain a healthy weight. Extra weight puts stress on your back and makes it difficult to have good posture.  Avoid activities or situations that make you feel anxious or stressed. Stress and  anxiety increase muscle tension and can make back pain worse. Learn ways to manage anxiety and stress, such as through exercise. General instructions  Sleep on a firm mattress in a comfortable position. Try lying on your side with your knees slightly bent. If you lie on your back, put a pillow under your knees.  Follow your treatment plan as told by your health care provider. This may include: ? Cognitive or behavioral therapy. ? Acupuncture or massage therapy. ? Meditation or yoga. Contact a health care provider if:  You have pain that is not relieved with rest or medicine.  You have increasing pain going down into your legs or buttocks.  Your pain does not improve after 2 weeks.  You have pain at night.  You lose weight without trying.  You have a fever or chills. Get help right away if:  You develop new bowel or bladder control problems.  You have unusual weakness or numbness in your arms or legs.  You develop nausea or vomiting.  You develop abdominal pain.  You feel faint. Summary  Acute back pain is sudden and usually short-lived.  Use proper lifting techniques. When you bend and lift, use positions that put less stress on your back.  Take over-the-counter and prescription medicines and apply heat or ice as directed by your health care provider. This information is not intended to replace advice given to you by your health care provider. Make sure you discuss any questions you have with your health care provider. Document Revised: 08/20/2020 Document Reviewed: 08/20/2020 Elsevier Patient Education  2021 [place].   Migraine Headache A migraine headache is a very strong throbbing pain on one side or both sides of your head. This type of headache can also cause other symptoms. It can last from 4 hours to 3 days. Talk with your doctor about what things may bring on (trigger) this condition. What are the causes? The exact cause of this condition is not known.  This condition may be triggered or caused by:  Drinking alcohol.  Smoking.  Taking medicines, such as: ? Medicine used to treat chest pain (nitroglycerin). ? Birth control pills. ? Estrogen. ? Some blood pressure medicines.  Eating or drinking certain products.  Doing physical activity. Other things that may trigger a migraine headache include:  Having a menstrual period.  Pregnancy.  Hunger.  Stress.  Not getting enough sleep or getting too much sleep.  Weather changes.  Tiredness (fatigue). What increases the risk?  Being 54-85 years old.  Being female.  Having a family history of migraine headaches.  Being Caucasian.  Having depression or anxiety.  Being very overweight. What are the signs or symptoms?  A throbbing pain. This pain may: ? Happen in any area of the head, such as on one side or both sides. ? Make it hard to do daily activities. ?  Get worse with physical activity. ? Get worse around bright lights or loud noises.  Other symptoms may include: ? Feeling sick to your stomach (nauseous). ? Vomiting. ? Dizziness. ? Being sensitive to bright lights, loud noises, or smells.  Before you get a migraine headache, you may get warning signs (an aura). An aura may include: ? Seeing flashing lights or having blind spots. ? Seeing bright spots, halos, or zigzag lines. ? Having tunnel vision or blurred vision. ? Having numbness or a tingling feeling. ? Having trouble talking. ? Having weak muscles.  Some people have symptoms after a migraine headache (postdromal phase), such as: ? Tiredness. ? Trouble thinking (concentrating). How is this treated?  Taking medicines that: ? Relieve pain. ? Relieve the feeling of being sick to your stomach. ? Prevent migraine headaches.  Treatment may also include: ? Having acupuncture. ? Avoiding foods that bring on migraine headaches. ? Learning ways to control your body functions (biofeedback). ? Therapy  to help you know and deal with negative thoughts (cognitive behavioral therapy). Follow these instructions at home: Medicines  Take over-the-counter and prescription medicines only as told by your doctor.  Ask your doctor if the medicine prescribed to you: ? Requires you to avoid driving or using heavy machinery. ? Can cause trouble pooping (constipation). You may need to take these steps to prevent or treat trouble pooping:  Drink enough fluid to keep your pee (urine) pale yellow.  Take over-the-counter or prescription medicines.  Eat foods that are high in fiber. These include beans, whole grains, and fresh fruits and vegetables.  Limit foods that are high in fat and sugar. These include fried or sweet foods. Lifestyle  Do not drink alcohol.  Do not use any products that contain nicotine or tobacco, such as cigarettes, e-cigarettes, and chewing tobacco. If you need help quitting, ask your doctor.  Get at least 8 hours of sleep every night.  Limit and deal with stress. General instructions  Keep a journal to find out what may bring on your migraine headaches. For example, write down: ? What you eat and drink. ? How much sleep you get. ? Any change in what you eat or drink. ? Any change in your medicines.  If you have a migraine headache: ? Avoid things that make your symptoms worse, such as bright lights. ? It may help to lie down in a dark, quiet room. ? Do not drive or use heavy machinery. ? Ask your doctor what activities are safe for you.  Keep all follow-up visits as told by your doctor. This is important.      Contact a doctor if:  You get a migraine headache that is different or worse than others you have had.  You have more than 15 headache days in one month. Get help right away if:  Your migraine headache gets very bad.  Your migraine headache lasts longer than 72 hours.  You have a fever.  You have a stiff neck.  You have trouble seeing.  Your  muscles feel weak or like you cannot control them.  You start to lose your balance a lot.  You start to have trouble walking.  You pass out (faint).  You have a seizure. Summary  A migraine headache is a very strong throbbing pain on one side or both sides of your head. These headaches can also cause other symptoms.  This condition may be treated with medicines and changes to your lifestyle.  Keep a journal  to find out what may bring on your migraine headaches.  Contact a doctor if you get a migraine headache that is different or worse than others you have had.  Contact your doctor if you have more than 15 headache days in a month. This information is not intended to replace advice given to you by your health care provider. Make sure you discuss any questions you have with your health care provider. Document Revised: 03/21/2019 Document Reviewed: 01/09/2019 Elsevier Patient Education  [place].

## 2021-06-01 IMAGING — MR MR CERVICAL SPINE WO/W CM
7 of 8 series · 34 of 48 positions shown · IV contrast (gadavist)
Comparison: None.

CLINICAL DATA: Multiple sclerosis, left eye pressure, dizziness

EXAM:
MRI HEAD AND ORBITS WITHOUT AND WITH CONTRAST
MRI CERVICAL SPINE WITHOUT AND WITH CONTRAST
TECHNIQUE: Multiplanar, multiecho pulse sequences of the brain and surrounding
structures were obtained without and with intravenous contrast.
Multiplanar, multiecho pulse sequences of the orbits and surrounding
structures were obtained including fat saturation techniques, before
and after intravenous contrast administration. Multiplanar,
multiecho pulse sequences of the cervical spine were obtained
without and with intravenous contrast.
CONTRAST:  7mL GADAVIST GADOBUTROL 1 MMOL/ML IV SOLN

[Series 30: T1 · sagittal · 3.0mm · 0.59mm/px · 4 of 15 slices shown (1 of 2)]
[im 1/15]
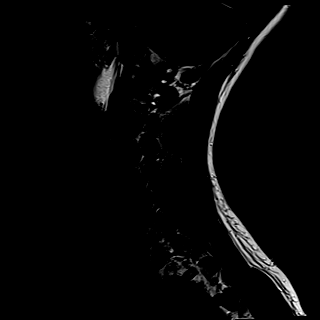
[im 5/15]
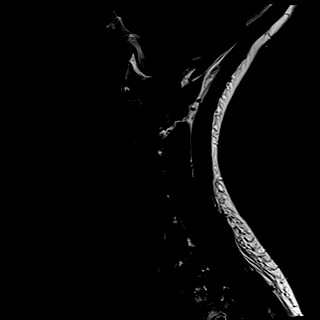
[im 10/15]
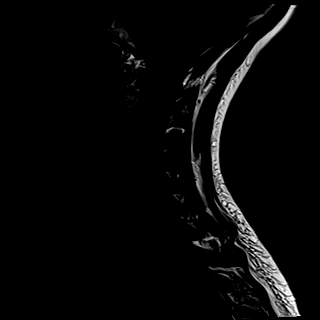
[im 15/15]
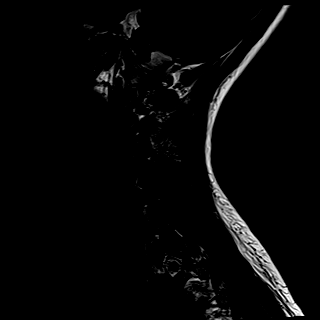

[Series 31: T2 · sagittal · 3.0mm · 0.59mm/px · 4 of 15 slices shown (1 of 2)]
[im 1/15]
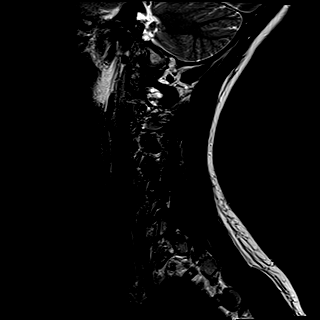
[im 5/15]
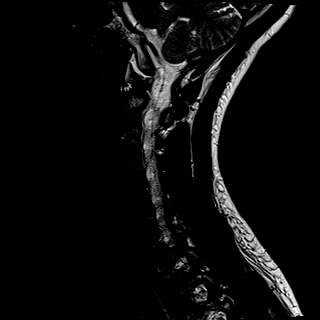
[im 10/15]
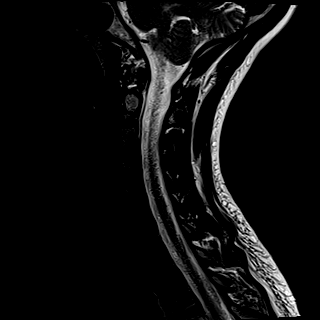
[im 15/15]
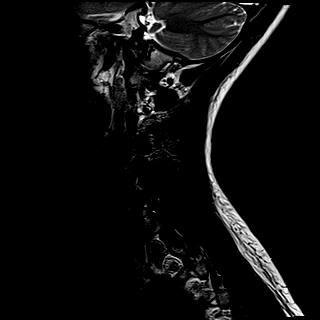

[Series 32: STIR · sagittal · 3.0mm · 0.74mm/px · 4 of 15 slices shown]
[im 1/15]
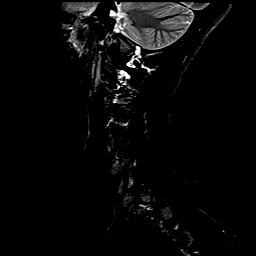
[im 5/15]
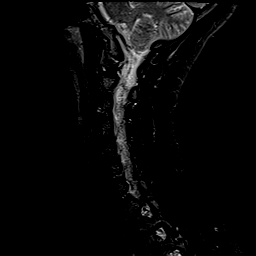
[im 10/15]
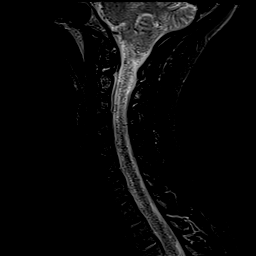
[im 15/15]
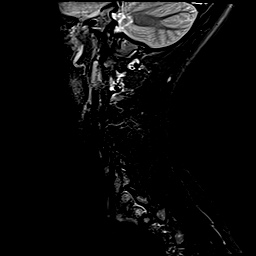

[Series 33: T2 · axial · 3.0mm · 0.70mm/px · z∈[-164,-63]mm · 8 of 30 slices shown (2 of 2)]
[im 1/30]
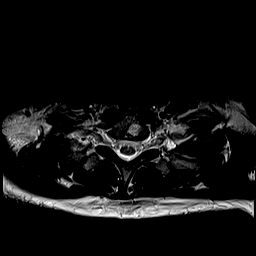
[im 5/30]
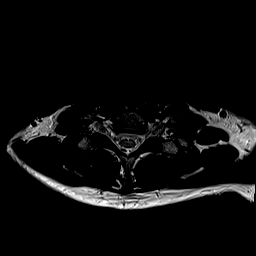
[im 9/30]
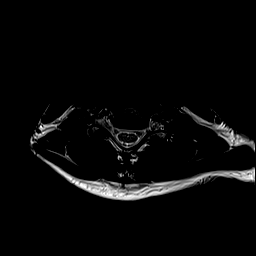
[im 13/30]
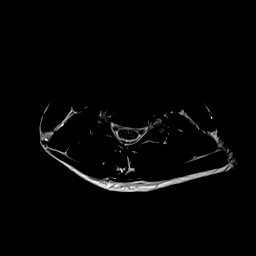
[im 17/30]
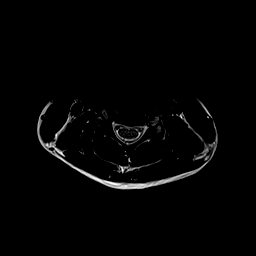
[im 21/30]
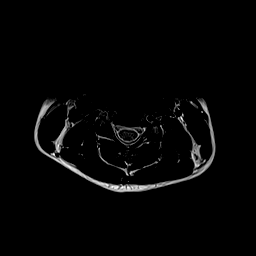
[im 25/30]
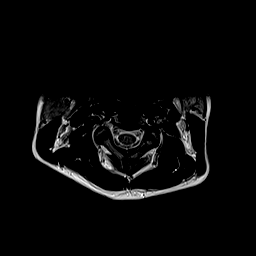
[im 30/30]
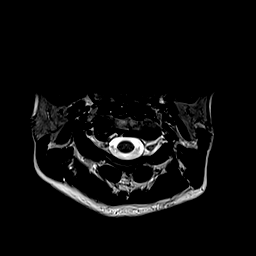

[Series 35: T1 · axial · 3.0mm · 0.35mm/px · z∈[-164,-63]mm · 8 of 30 slices shown (2 of 2)]
[im 1/30]
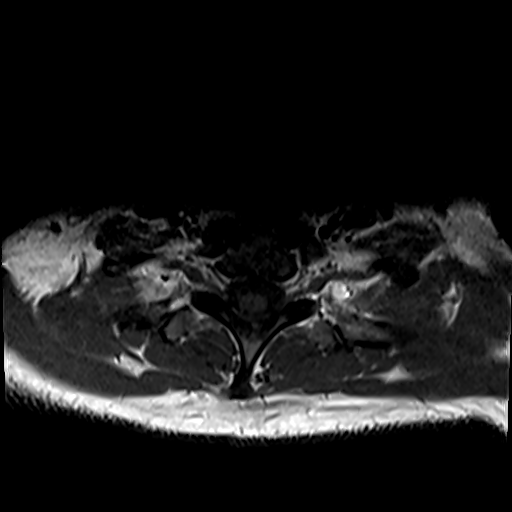
[im 5/30]
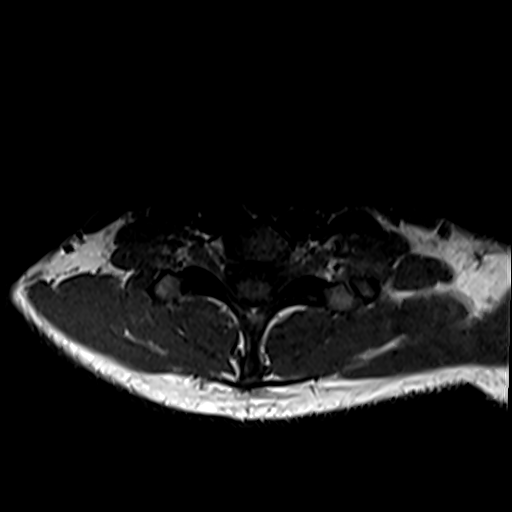
[im 9/30]
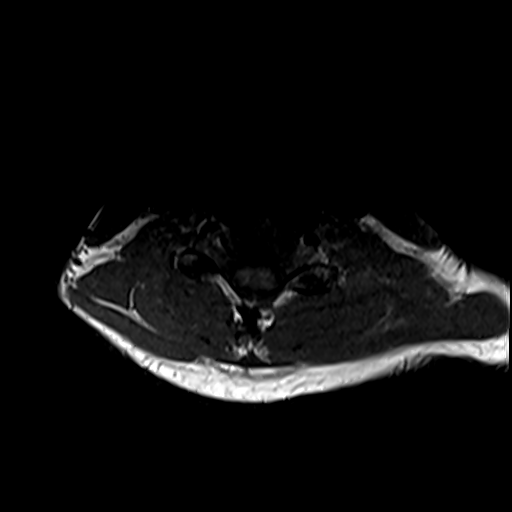
[im 13/30]
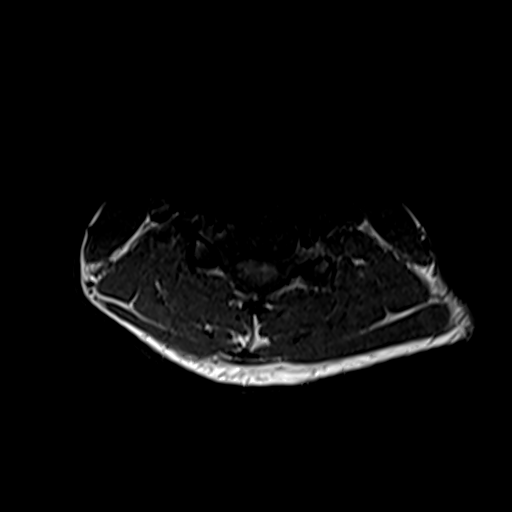
[im 17/30]
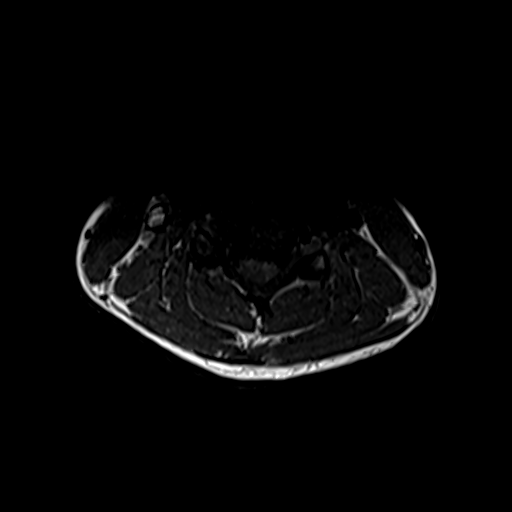
[im 21/30]
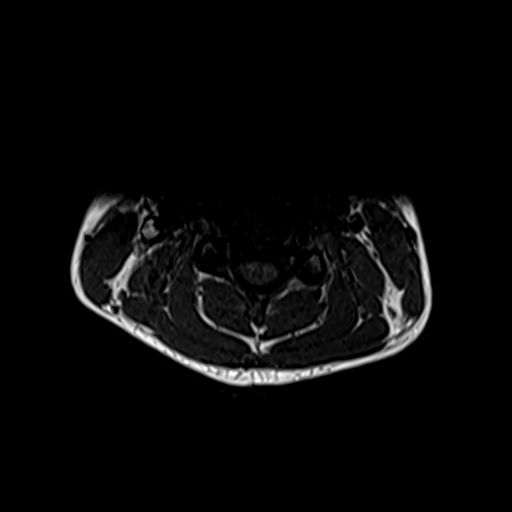
[im 25/30]
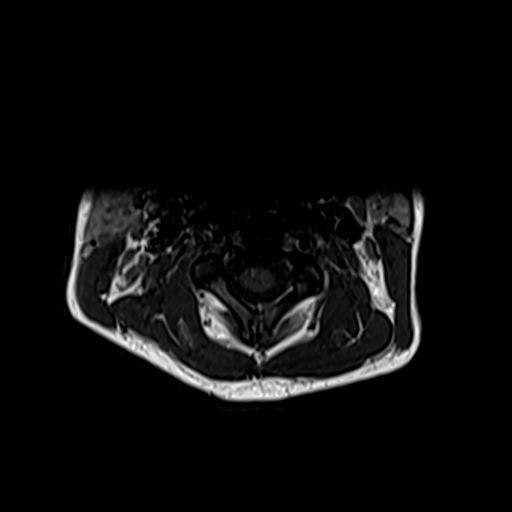
[im 30/30]
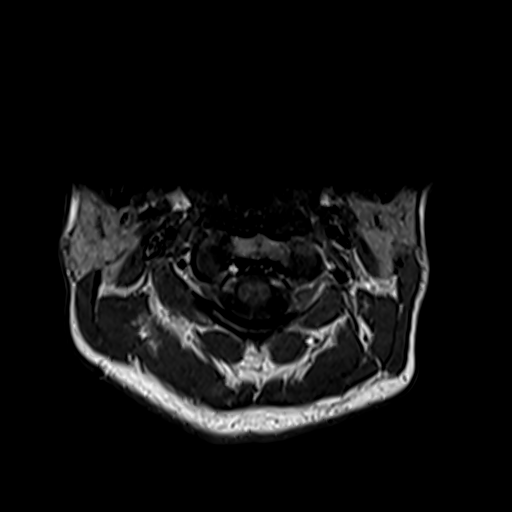

[Series 36: T1 fat-sat post-contrast · sagittal · 3.0mm · 0.59mm/px · 4 of 15 slices shown]
[im 1/15]
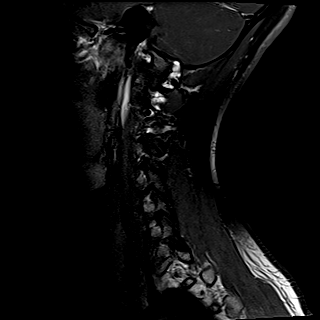
[im 5/15]
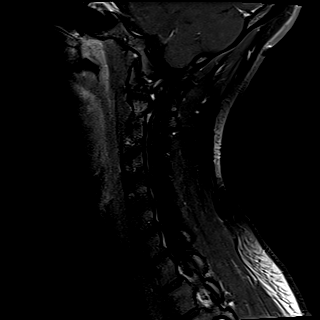
[im 10/15]
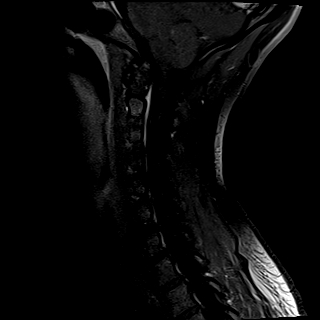
[im 15/15]
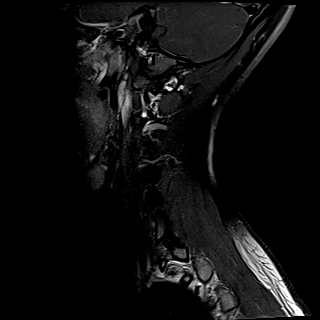

[Series 37: T1 post-contrast · axial · 3.0mm · 0.35mm/px · z∈[-164,-150]mm · 2 of 30 slices shown]
[im 1/30]
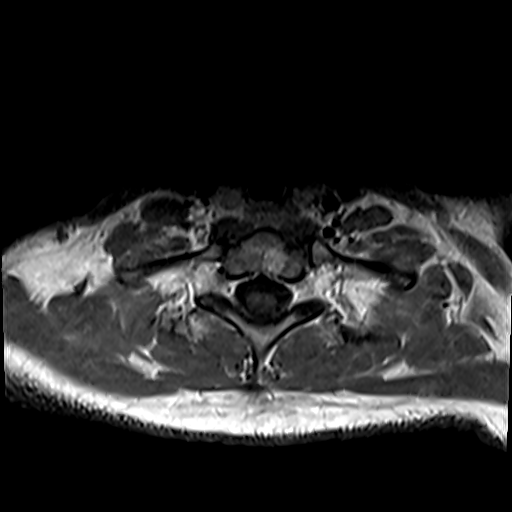
[im 5/30]
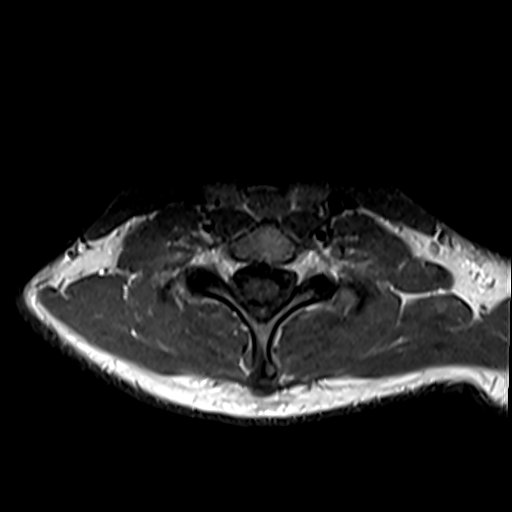

[34 of 48 positions shown; findings below may reference images not displayed]

FINDINGS: MRI HEAD

Brain: Minimal small foci of T2 hyperintensity in the central white
matter. No acute infarction or intracranial hemorrhage. Ventricles
and sulci are normal in size and configuration. There is no
intracranial mass, mass effect, edema, hydrocephalus, or extra-axial
collection. No abnormal enhancement.

Vascular: Major vessel flow voids at the skull base are preserved.

Skull and upper cervical spine: Normal marrow signal.

Other: Sella is unremarkable.  Mastoid air cells are clear.

MRI ORBITS

Orbits: There is no intraorbital mass. Globes, extraocular muscles,
and lacrimal glands are symmetric and unremarkable. There is no
abnormal enhancement of the optic nerve sheath complexes.

Visualized sinuses: Mild mucosal thickening

Soft tissues: Negative.

MRI CERVICAL SPINE

Alignment: Physiologic.

Vertebrae: Vertebral body heights are maintained. There is no marrow
edema. There is likely an atypical vertebral body hemangioma at T1.

Cord: Normal signal.  No abnormal enhancement.

Intervertebral discs: Disc heights and signal are preserved. There
is no disc herniation or stenosis at any level.
IMPRESSION: Minimal foci of T2 hyperintensity in the cerebral white matter are
nonspecific but may reflect chronic demyelination given reported
history of multiple sclerosis.

There is no evidence of optic neuritis or active demyelination.

No abnormal cord signal or enhancement.

## 2021-06-02 ENCOUNTER — Ambulatory Visit: Payer: Medicaid Other | Admitting: Neurology

## 2021-06-02 ENCOUNTER — Encounter: Payer: Self-pay | Admitting: Neurology

## 2021-06-02 VITALS — BP 120/74 | HR 72 | Ht 63.0 in | Wt 141.5 lb

## 2021-06-02 DIAGNOSIS — R9082 White matter disease, unspecified: Secondary | ICD-10-CM | POA: Diagnosis not present

## 2021-06-02 DIAGNOSIS — G43709 Chronic migraine without aura, not intractable, without status migrainosus: Secondary | ICD-10-CM | POA: Diagnosis not present

## 2021-06-02 DIAGNOSIS — H539 Unspecified visual disturbance: Secondary | ICD-10-CM

## 2021-06-02 DIAGNOSIS — R2689 Other abnormalities of gait and mobility: Secondary | ICD-10-CM | POA: Diagnosis not present

## 2021-06-02 MED ORDER — IMIPRAMINE HCL 25 MG PO TABS: 25.0000 mg | ORAL_TABLET | Freq: Every day | ORAL | 1 refills | Status: DC

## 2021-06-02 MED ORDER — ZONISAMIDE 100 MG PO CAPS: ORAL_CAPSULE | ORAL | 3 refills | Status: DC

## 2021-06-02 NOTE — Progress Notes (Addendum)
[facility]  PATIENT: Leah Burns DOB: September 18, 1991  REFERRING DOCTOR OR PCP: Dr. Sabra Heck (neurology); Salem Caster (PCP) SOURCE: Patient, notes from Dr. Sabra Heck, imaging reports, MRI images personally reviewed.  _________________________________   HISTORICAL  CHIEF COMPLAINT:  Chief Complaint  Patient presents with   Follow-up    RM 12, alone. Last seen 12/28/20. Eyes are ok. Legs are bothering her. When she stands up, legs feel weak. Toes on L foot keep twitching all throughout the day. Since April, has had about 3-4 falls. Denies hitting head or significant injury. She is right handed, having trouble with handwriting.    Migraine    Has been on Emgality since January, ineffective. Has a headache today. Has not tried other CGRP's. Wants to discuss other options.    HISTORY OF PRESENT ILLNESS:  Leah Burns is a 30 y.o. woman who was diagnosed with MS in the past and has headaches.   Update 06/02/2021  She reports that her legs feel weaker since a couple weeks ago.   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  .   She feels she needs to grab on to something when she stands up.   Her left foot toes are twitching.   She notes that her hands is clumsy with writing.   She notes dysesthesias in her legs, esp at night.  Compared to a couple weeks ago she feels slightly worse.  Vision is doing well currently.   She has stable urge incontinenece and mild leakage at times   Emgality had not helped -- she did 5 monthly injections and noted no benefit.     HA are occurring about twice a week and are bifrontal.   Sometimes she gets nausea with them.    When the headache is present she nausea, rare vomiting, photophobia and phonophobia.   Moving her head worsens the intensity.   When a migraine occurs she rests and takes ondansetron.  Before Emgality, she was placed on amitriptyline, nortriptyline, NSAIDs and gabapentin in the past for her headaches but these never helped.   She had been on topiramate but does not remember if it helped.   She had been on sumatriptan in the past without much benefit.         History of visual symptoms and being given a diagnosis of MS She is a 30 year old woman who was diagnosed with MS in 2014 and recently had vision changes on the left.   In 2014, she had left sided weakness and had bladder and bowel incontinence.   She also had visual changes and she states she was diagnosed with ON.   She had MRI's and then was diagnosed with multiple sclerosis.  We do not have details of those images.  She began to see Dr. Lavone Neri, a neurologist at [facility] (now retired).  She was on Aubagio for about a year but stopped as she preferred to try a herbal route.    She has had fluctuating numbness on the left butno new neurologic symptoms until a couple weeks ago.  An MRI report from 2016 was read as being unremarkable.  She has also had fatigue.  She also reports having optic neuritis again in 2017.     She had some new symptoms last moth.   She noted left arm numbness and weakness.,   She had the onset of left visual changes and also noted left ptosis and had trouble opening her eye.  Of note, she had a  bad headache at that time.      She saw Dr. Katy Fitch and she reports there was no evidence of optic neuriitis.  Due to the symptoms, she had imaging studies.  I personally reviewed the MRI of the brain, orbits and cervical spine performed 12/15/2020, there are some nonspecific foci in the hemispheres.  There was no in enhancement of the optic nerves.  The spinal cord appears normal.  Imaging review: MRI of the brain 12/25/2014 (images not available, performed at [facility] in [place]) was read as unremarkable  MRI of the brain 12/15/2020 showed a few T2/FLAIR hyperintense foci in the hemispheres in a nonspecific pattern.  MRI of the orbits 12/15/2020 showed no enhancement of the optic nerves.  MRI of the cervical spine 12/15/2020 was normal  REVIEW OF  SYSTEMS: Constitutional: No fevers, chills, sweats, or change in appetite Eyes: No visual changes, double vision, eye pain Ear, nose and throat: No hearing loss, ear pain, nasal congestion, sore throat Cardiovascular: No chest pain, palpitations Respiratory:  No shortness of breath at rest or with exertion.   No wheezes GastrointestinaI: No nausea, vomiting, diarrhea, abdominal pain, fecal incontinence Genitourinary:  No dysuria, urinary retention or frequency.  No nocturia. Musculoskeletal:  No neck pain, back pain Integumentary: No rash, pruritus, skin lesions Neurological: as above Psychiatric: No depression at this time.  No anxiety Endocrine: No palpitations, diaphoresis, change in appetite, change in weigh or increased thirst Hematologic/Lymphatic:  No anemia, purpura, petechiae. Allergic/Immunologic: No itchy/runny eyes, nasal congestion, recent allergic reactions, rashes  ALLERGIES: No Known Allergies  HOME MEDICATIONS:  Current Outpatient Medications:    albuterol (PROVENTIL HFA;VENTOLIN HFA) 108 (90 BASE) MCG/ACT inhaler, Inhale 1-2 puffs into the lungs every 6 (six) hours as needed for wheezing or shortness of breath., Disp: 1 Inhaler, Rfl: 0   imipramine (TOFRANIL) 25 MG tablet, Take 1 tablet (25 mg total) by mouth at bedtime., Disp: 90 tablet, Rfl: 1   sertraline (ZOLOFT) 50 MG tablet, Take 1 tablet by mouth daily., Disp: , Rfl:    zonisamide (ZONEGRAN) 100 MG capsule, One po qHS., Disp: 90 capsule, Rfl: 3   famotidine (PEPCID) 40 MG tablet, Take 40 mg by mouth daily., Disp: , Rfl:    Galcanezumab-gnlm (EMGALITY) 120 MG/ML SOAJ, Inject 1 pen into the skin every 28 (twenty-eight) days., Disp: 3 mL, Rfl: 4   lidocaine (LIDODERM) 5 %, Place 1 patch onto the skin daily. Remove & Discard patch within 12 hours or as directed by MD, Disp: 30 patch, Rfl: 0   methocarbamol (ROBAXIN) 500 MG tablet, Take 1 tablet (500 mg total) by mouth 2 (two) times daily., Disp: 20 tablet, Rfl: 0    omeprazole (PRILOSEC) 40 MG capsule, Take 40 mg by mouth 2 (two) times daily., Disp: , Rfl:    ondansetron (ZOFRAN) 4 MG tablet, Take 1 tablet (4 mg total) by mouth every 8 (eight) hours as needed for nausea or vomiting., Disp: 20 tablet, Rfl: 0   rizatriptan (MAXALT) 10 MG tablet, Take 1 tablet (10 mg total) by mouth as needed for migraine. May repeat in 2 hours if needed, Disp: 10 tablet, Rfl: 5  PAST MEDICAL HISTORY: Past Medical History:  Diagnosis Date   Asthma    Endometriosis    MS (multiple sclerosis) ([place])     PAST SURGICAL HISTORY: Past Surgical History:  Procedure Laterality Date   ABDOMINAL HYSTERECTOMY     partial   BREAST REDUCTION SURGERY Bilateral 09/14/2020   Procedure: BILATERAL MAMMARY REDUCTION  (BREAST);  Surgeon: Cindra Presume, MD;  Location: [facility];  Service: Plastics;  Laterality: Bilateral;   DILATION AND EVACUATION      FAMILY HISTORY: Family History  Problem Relation Age of Onset   Healthy Mother    Macular degeneration Mother    Hypertension Father    High Cholesterol Father     SOCIAL HISTORY:  Social History   Socioeconomic History   Marital status: Single    Spouse name: Not on file   Number of children: Not on file   Years of education: Not on file   Highest education level: Not on file  Occupational History   Not on file  Tobacco Use   Smoking status: Never   Smokeless tobacco: Never  Vaping Use   Vaping Use: Never used  Substance and Sexual Activity   Alcohol use: Yes    Alcohol/week: 1.0 standard drink    Types: 1 Glasses of wine per week    Comment: occas-wine   Drug use: Never   Sexual activity: Yes    Birth control/protection: Injection  Other Topics Concern   Not on file  Social History Narrative   Tea- 2 times per week   Right handed   Social Determinants of Health   Financial Resource Strain: Not on file  Food Insecurity: Not on file  Transportation Needs: Not on file  Physical Activity:  Not on file  Stress: Not on file  Social Connections: Not on file  Intimate Partner Violence: Not on file     PHYSICAL EXAM  Vitals:   06/02/21 1417  BP: 120/74  Pulse: 72  Weight: 141 lb 8 oz (64.2 kg)  Height: 5\' 3"  (1.6 m)    Body mass index is 25.07 kg/m.   No results found.     General: The patient is well-developed and well-nourished and in no acute distress  HEENT:  Head is [place]/AT.  Sclera are anicteric.     Neck:    The neck is nontender.  Skin: Extremities are without rash or  edema.   Neurologic Exam  Mental status: The patient is alert and oriented x 3 at the time of the examination. The patient has apparent normal recent and remote memory, with an apparently normal attention span and concentration ability.   Speech is normal.  Cranial nerves: Extraocular movements are full.  Facial strength and sensation. No obvious hearing deficits are noted.  Motor:  Muscle bulk is normal.   Tone is normal. Strength is  5 / 5 in all 4 extremities.   Sensory: Sensory testing is intact to pinprick, soft touch and vibration sensation in and mildly asymmetric vibration  sensation  Coordination: Cerebellar testing reveals good finger-nose-finger and heel-to-shin bilaterally.  Gait and station: Station is normal.   The gait was fairly normal but the tandem gait was wide.  Romberg is negative.   Reflexes: Deep tendon reflexes are symmetric and normal bilaterally.   Plantar responses are flexor.    DIAGNOSTIC DATA (LABS, IMAGING, TESTING) - I reviewed patient records, labs, notes, testing and imaging myself where available.  Lab Results  Component Value Date   WBC 9.0 04/07/2021   HGB 13.3 04/07/2021   HCT 40.1 04/07/2021   MCV 83.9 04/07/2021   PLT 164 04/07/2021      Component Value Date/Time   NA 135 04/07/2021 1239   K 3.5 04/07/2021 1239   CL 104 04/07/2021 1239   CO2 21 (L) 04/07/2021 1239   GLUCOSE 93  04/07/2021 1239   BUN 12 04/07/2021 1239    CREATININE 0.95 04/07/2021 1239   CALCIUM 9.9 04/07/2021 1239   PROT 8.1 04/07/2021 1239   ALBUMIN 5.4 (H) 04/07/2021 1239   AST 21 04/07/2021 1239   ALT 12 04/07/2021 1239   ALKPHOS 37 (L) 04/07/2021 1239   BILITOT 0.9 04/07/2021 1239   GFRNONAA >60 04/07/2021 1239       ASSESSMENT AND PLAN  White matter abnormality on MRI of brain - Plan: MR BRAIN W WO CONTRAST, MR CERVICAL SPINE W WO CONTRAST  Chronic migraine w/o aura w/o status migrainosus, not intractable - Plan: MR BRAIN W WO CONTRAST  Balance disorder - Plan: MR BRAIN W WO CONTRAST, MR CERVICAL SPINE W WO CONTRAST  Visual disturbance  Unclear if current symptoms represent a neurologic event.  However, since she is noting difficulties with her balance and she has urinary dysfunction we will check an MRI of the brain and cervical spine.  I believe the likelihood she has MS is low but if she has new lesions in the brain or if the cervical spine shows any foci we would need to reconsider the diagnosis. For her headaches she will start zonisamide.  She will also start imipramine for the dysesthetic pain.  This may also help the headaches. She will return to see [place] in 6 months or sooner if there are new or worsening neurologic symptoms.  Alanya Vukelich A. Felecia Shelling, MD, [facility] 0/81/4481, 8:56 PM Certified in Neurology, Clinical Neurophysiology, Sleep Medicine and Neuroimaging  [facility] [address] [place], [place] [address] [phone]

## 2021-06-09 ENCOUNTER — Encounter: Payer: Self-pay | Admitting: Neurology

## 2021-06-19 ENCOUNTER — Other Ambulatory Visit: Payer: Medicaid Other

## 2021-07-09 ENCOUNTER — Ambulatory Visit
Admission: RE | Admit: 2021-07-09 | Discharge: 2021-07-09 | Disposition: A | Discharge status: Home / Self Care | Payer: Medicaid Other | Source: Ambulatory Visit | Attending: Neurology | Admitting: Neurology

## 2021-07-09 DIAGNOSIS — R9082 White matter disease, unspecified: Secondary | ICD-10-CM

## 2021-07-09 DIAGNOSIS — G43709 Chronic migraine without aura, not intractable, without status migrainosus: Secondary | ICD-10-CM

## 2021-07-09 DIAGNOSIS — R2689 Other abnormalities of gait and mobility: Secondary | ICD-10-CM | POA: Diagnosis not present

## 2021-07-09 MED ORDER — GADOBENATE DIMEGLUMINE 529 MG/ML IV SOLN
15.0000 mL | Freq: Once | INTRAVENOUS | Status: AC | PRN
  Administered 2021-07-09: 15 mL via INTRAVENOUS

## 2021-11-29 ENCOUNTER — Ambulatory Visit: Payer: Medicaid Other | Admitting: Neurology

## 2021-12-27 ENCOUNTER — Telehealth: Payer: Self-pay | Admitting: Neurology

## 2021-12-27 NOTE — Telephone Encounter (Signed)
Last week started having pain left side of back down to left arm, numbness. pain right side of face.  Scheduled appt 12/29/21 at 1:00p

## 2021-12-27 NOTE — Telephone Encounter (Signed)
Called the patient back to get more information. Last week she states she developed a pain/spasm on the left side of her back. She states this is shooting into the left arm and hand area. She states this has been constant since starting last week. She has noticed also blurry vision intermittently as well.  Denies any signs of infection. Pt states in past she received prednisone and the last time she got that was a couple month ago.  Pt is scheduled for upcoming apt on Thursday and advised that she will be further evaluated at the upcoming apt.

## 2021-12-29 ENCOUNTER — Ambulatory Visit: Payer: Medicaid Other | Admitting: Neurology

## 2021-12-29 ENCOUNTER — Encounter: Payer: Self-pay | Admitting: Neurology

## 2021-12-29 VITALS — BP 113/71 | HR 102 | Ht 63.0 in | Wt 139.0 lb

## 2021-12-29 DIAGNOSIS — G4489 Other headache syndrome: Secondary | ICD-10-CM

## 2021-12-29 DIAGNOSIS — M2550 Pain in unspecified joint: Secondary | ICD-10-CM

## 2021-12-29 DIAGNOSIS — R200 Anesthesia of skin: Secondary | ICD-10-CM

## 2021-12-29 DIAGNOSIS — G43709 Chronic migraine without aura, not intractable, without status migrainosus: Secondary | ICD-10-CM | POA: Diagnosis not present

## 2021-12-29 MED ORDER — METHYLPREDNISOLONE 4 MG PO TABS: ORAL_TABLET | ORAL | 0 refills | Status: DC

## 2021-12-29 NOTE — Progress Notes (Signed)
[facility]  PATIENT: Leah Burns DOB: 20-Nov-1991  REFERRING DOCTOR OR PCP: Dr. Sabra Heck (neurology); [place] Caster (PCP) SOURCE: Patient, notes from Dr. Sabra Heck, imaging reports, MRI images personally reviewed.  _________________________________   HISTORICAL  CHIEF COMPLAINT:  Chief Complaint  Patient presents with   Follow-up    Rm 1, alone. Pt is having a MS flare up. L side back pn down to L arm and L leg; that started last week. Weakness in hand and feet. Numbness and pn on R side of face. Per pt her toes wont stop jumping. Few days prio pt noticed her vision was blurry and eyes could not focus even w glasses on.     HISTORY OF PRESENT ILLNESS:  Leah Burns is a 31 y.o. woman who was diagnosed with MS in the past and has headaches.    Update 12/29/2021 She reports more numbness in her left arm and foot.   She also reports that she has had more visual blurriness and reduced focusing.   She feels much more tired.     She also reports a pins/needles sensation in her right face.    She reports that her legs feel weaker since a couple weeks ago.   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  .   She feels she needs to grab on to something when she stands up.   Her left foot toes are twitching.   She notes that her hands is clumsy with writing.   She notes dysesthesias in her legs, esp at night.  Compared to a couple weeks ago she feels slightly worse.  Vision is doing well currently.   She has stable urge incontinenece and mild leakage at times  She continues to report headaches.    HAs are located in the occiput some days and in the temples other days.   Pain is sometimes behind her eyes.   Often she notes nausea and photophobia.    Moving her head worsens the intensity.   When a migraine occurs she rests and takes ondansetron.   Medications tried for headaches in past:    Emgality,  amitriptyline, imipramine, nortriptyline, NSAIDs and gabapentin in the past  for her headaches but these had not  helped.  Topiramate and zonisamide.   She had been on sumatriptan in the past without much benefit.     History of visual symptoms and being given a diagnosis of MS She  was diagnosed with MS in 2014 and recently had vision changes on the left.   In 2014, she had left sided weakness and had bladder and bowel incontinence.   She also had visual changes and she states she was diagnosed with ON.   She had MRI's and then was diagnosed with multiple sclerosis.  We do not have details of those images.  She began to see Dr. Lavone Neri, a neurologist at [facility] (now retired).  She was on Aubagio for about a year but stopped as she preferred to try a herbal route.    She has had fluctuating numbness on the left butno new neurologic symptoms until a couple weeks ago.  An MRI report from 2016 was read as being unremarkable.  She has also had fatigue.  She also reports having optic neuritis again in 2017.     She had some new symptoms last moth.   She noted left arm numbness and weakness.,   She had the onset of left visual changes and also noted left  ptosis and had trouble opening her eye.  Of note, she had a bad headache at that time.      She saw Dr. Katy Fitch and she reports there was no evidence of optic neuriitis.  Due to the symptoms, she had imaging studies.  I personally reviewed the MRI of the brain, orbits and cervical spine performed 12/15/2020, there are some nonspecific foci in the hemispheres.  There was no in enhancement of the optic nerves.  The spinal cord appears normal.   Imaging review: MRI of the brain 12/25/2014 (images not available, performed at [facility] in [place]) was read as unremarkable  MRI of the brain 12/15/2020 showed a few T2/FLAIR hyperintense foci in the hemispheres in a nonspecific pattern.  MRI of the orbits 12/15/2020 showed no enhancement of the optic nerves.  MRI of the cervical spine 12/15/2020 was normal  MRI of the brain 07/09/2021 showed nonspecific  T2/FLAIR hyperintense foci in the deep and periventricular white matter.  None of the foci appear to be acute.  They do not enhance.  Compared to the MRI from 12/15/2020, there are no new lesions.  The pattern is nonspecific and could be due to chronic microvascular ischemic change, sequela of migraine headaches or demyelination.  MRI of the cervical spine 07/09/2021 was normal    REVIEW OF SYSTEMS: Constitutional: No fevers, chills, sweats, or change in appetite Eyes: No visual changes, double vision, eye pain Ear, nose and throat: No hearing loss, ear pain, nasal congestion, sore throat Cardiovascular: No chest pain, palpitations Respiratory:  No shortness of breath at rest or with exertion.   No wheezes GastrointestinaI: No nausea, vomiting, diarrhea, abdominal pain, fecal incontinence Genitourinary:  No dysuria, urinary retention or frequency.  No nocturia. Musculoskeletal:  No neck pain, back pain Integumentary: No rash, pruritus, skin lesions Neurological: as above Psychiatric: No depression at this time.  No anxiety Endocrine: No palpitations, diaphoresis, change in appetite, change in weigh or increased thirst Hematologic/Lymphatic:  No anemia, purpura, petechiae. Allergic/Immunologic: No itchy/runny eyes, nasal congestion, recent allergic reactions, rashes  ALLERGIES: No Known Allergies  HOME MEDICATIONS:  Current Outpatient Medications:    albuterol (PROVENTIL HFA;VENTOLIN HFA) 108 (90 BASE) MCG/ACT inhaler, Inhale 1-2 puffs into the lungs every 6 (six) hours as needed for wheezing or shortness of breath., Disp: 1 Inhaler, Rfl: 0   omeprazole (PRILOSEC) 40 MG capsule, Take 40 mg by mouth 2 (two) times daily., Disp: , Rfl:    ondansetron (ZOFRAN) 4 MG tablet, Take 1 tablet (4 mg total) by mouth every 8 (eight) hours as needed for nausea or vomiting., Disp: 20 tablet, Rfl: 0   rizatriptan (MAXALT) 10 MG tablet, Take 1 tablet (10 mg total) by mouth as needed for migraine. May  repeat in 2 hours if needed, Disp: 10 tablet, Rfl: 5   sertraline (ZOLOFT) 50 MG tablet, Take 1 tablet by mouth daily., Disp: , Rfl:   PAST MEDICAL HISTORY: Past Medical History:  Diagnosis Date   Asthma    Endometriosis    MS (multiple sclerosis) ([place])     PAST SURGICAL HISTORY: Past Surgical History:  Procedure Laterality Date   ABDOMINAL HYSTERECTOMY     partial   BREAST REDUCTION SURGERY Bilateral 09/14/2020   Procedure: BILATERAL MAMMARY REDUCTION  (BREAST);  Surgeon: Cindra Presume, MD;  Location: [place];  Service: Plastics;  Laterality: Bilateral;   DILATION AND EVACUATION      FAMILY HISTORY: Family History  Problem Relation Age of Onset   Healthy  Mother    Macular degeneration Mother    Hypertension Father    High Cholesterol Father     SOCIAL HISTORY:  Social History   Socioeconomic History   Marital status: Single    Spouse name: Not on file   Number of children: Not on file   Years of education: Not on file   Highest education level: Not on file  Occupational History   Not on file  Tobacco Use   Smoking status: Never   Smokeless tobacco: Never  Vaping Use   Vaping Use: Never used  Substance and Sexual Activity   Alcohol use: Yes    Alcohol/week: 1.0 standard drink    Types: 1 Glasses of wine per week    Comment: occas-wine   Drug use: Never   Sexual activity: Yes    Birth control/protection: Injection  Other Topics Concern   Not on file  Social History Narrative   Tea- 2 times per week   Right handed   Social Determinants of Health   Financial Resource Strain: Not on file  Food Insecurity: Not on file  Transportation Needs: Not on file  Physical Activity: Not on file  Stress: Not on file  Social Connections: Not on file  Intimate Partner Violence: Not on file     PHYSICAL EXAM  Vitals:   12/29/21 1240  BP: 113/71  Pulse: (!) 102  Weight: 139 lb (63 kg)  Height: 5\' 3"  (1.6 m)    Body mass index is 24.62  kg/m.   No results found.   General: The patient is well-developed and well-nourished and in no acute distress  HEENT:  Head is [place]/AT.  Sclera are anicteric.     Neck:    The neck is nontender.  Skin: Extremities are without rash or  edema.  Neurologic Exam  Mental status: The patient is alert and oriented x 3 at the time of the examination. The patient has apparent normal recent and remote memory, with an apparently normal attention span and concentration ability.   Speech is normal.  Cranial nerves: Extraocular movements are full.  Facial strength and sensation. No obvious hearing deficits are noted.  Motor:  Muscle bulk is normal.   Tone is normal. Strength is  5 / 5 in all 4 extremities.   Sensory: Sensory testing is intact to pinprick, soft touch and vibration sensation in and mildly asymmetric vibration  sensation  Coordination: Cerebellar testing reveals good finger-nose-finger and heel-to-shin bilaterally.  Gait and station: Station is normal.   The gait was fairly normal but the tandem gait was mildly wide.  Romberg is negative.   Reflexes: Deep tendon reflexes are symmetric and normal bilaterally.  Marland Kitchen    DIAGNOSTIC DATA (LABS, IMAGING, TESTING) - I reviewed patient records, labs, notes, testing and imaging myself where available.  Lab Results  Component Value Date   WBC 9.0 04/07/2021   HGB 13.3 04/07/2021   HCT 40.1 04/07/2021   MCV 83.9 04/07/2021   PLT 164 04/07/2021      Component Value Date/Time   NA 135 04/07/2021 1239   K 3.5 04/07/2021 1239   CL 104 04/07/2021 1239   CO2 21 (L) 04/07/2021 1239   GLUCOSE 93 04/07/2021 1239   BUN 12 04/07/2021 1239   CREATININE 0.95 04/07/2021 1239   CALCIUM 9.9 04/07/2021 1239   PROT 8.1 04/07/2021 1239   ALBUMIN 5.4 (H) 04/07/2021 1239   AST 21 04/07/2021 1239   ALT 12 04/07/2021 1239  ALKPHOS 37 (L) 04/07/2021 1239   BILITOT 0.9 04/07/2021 1239   GFRNONAA >60 04/07/2021 1239       ASSESSMENT AND  PLAN  Chronic migraine w/o aura w/o status migrainosus, not intractable  Left arm numbness  Other headache syndrome - Plan: Sedimentation rate, CBC with Differential/Platelet, C-reactive protein, Rheumatoid factor  Multiple joint pain - Plan: Sedimentation rate, C-reactive protein, Rheumatoid factor  She reports left sided numbness and right facial numbness.   I do not think she has MS as MRI brain just shows nonspecific foci unchanged x years.    MRI cervical is negative 2022.  Also no DJD For her headaches, I offered occipital nerve block but she prefers not to do this.  I will have her try a sterod pack.  I gave samples x 2 of Ubrelvy.   Consider Botox as failed many there meds including Emgality. She will return to see [place] in 6 months or sooner if there are new or worsening neurologic symptoms.  Seleta Hovland A. Felecia Shelling, MD, [facility] 08/13/8332, 8:32 PM Certified in Neurology, Clinical Neurophysiology, Sleep Medicine and Neuroimaging  [facility] [address], [place], [place] [address] [phone]

## 2021-12-30 LAB — CBC WITH DIFFERENTIAL/PLATELET
Basophils Absolute: 0 10*3/uL (ref 0.0–0.2)
Basos: 0 %
EOS (ABSOLUTE): 0.1 10*3/uL (ref 0.0–0.4)
Eos: 1 %
Hematocrit: 38.3 % (ref 34.0–46.6)
Hemoglobin: 12.3 g/dL (ref 11.1–15.9)
Immature Grans (Abs): 0 10*3/uL (ref 0.0–0.1)
Immature Granulocytes: 0 %
Lymphocytes Absolute: 2.5 10*3/uL (ref 0.7–3.1)
Lymphs: 37 %
MCH: 27.5 pg (ref 26.6–33.0)
MCHC: 32.1 g/dL (ref 31.5–35.7)
MCV: 86 fL (ref 79–97)
Monocytes Absolute: 0.3 10*3/uL (ref 0.1–0.9)
Monocytes: 5 %
Neutrophils Absolute: 3.8 10*3/uL (ref 1.4–7.0)
Neutrophils: 57 %
Platelets: 174 10*3/uL (ref 150–450)
RBC: 4.47 x10E6/uL (ref 3.77–5.28)
RDW: 13.4 % (ref 11.7–15.4)
WBC: 6.7 10*3/uL (ref 3.4–10.8)

## 2021-12-30 LAB — C-REACTIVE PROTEIN: CRP: <1 mg/L (ref 0–10)

## 2021-12-30 LAB — SEDIMENTATION RATE: Sed Rate: 2 mm/hr (ref 0–32)

## 2021-12-30 LAB — RHEUMATOID FACTOR: Rheumatoid fact SerPl-aCnc: 10.6 IU/mL (ref <14.0)

## 2022-02-17 ENCOUNTER — Telehealth: Payer: Self-pay | Admitting: Genetic Counselor

## 2022-02-17 NOTE — Telephone Encounter (Signed)
Scheduled appt per 3/9 referral. Pt is aware of appt date and time. Pt is aware to arrive 15 mins prior to appt time and to bring and updated insurance card. Pt is aware of appt location.   ?

## 2022-03-20 ENCOUNTER — Other Ambulatory Visit: Payer: Self-pay

## 2022-03-20 ENCOUNTER — Inpatient Hospital Stay: Payer: Medicaid Other

## 2022-03-20 ENCOUNTER — Inpatient Hospital Stay: Payer: Medicaid Other | Attending: Genetic Counselor | Admitting: Genetic Counselor

## 2022-03-20 DIAGNOSIS — Z803 Family history of malignant neoplasm of breast: Secondary | ICD-10-CM

## 2022-03-20 LAB — GENETIC SCREENING ORDER

## 2022-03-21 ENCOUNTER — Encounter: Payer: Self-pay | Admitting: Genetic Counselor

## 2022-03-21 DIAGNOSIS — Z803 Family history of malignant neoplasm of breast: Secondary | ICD-10-CM

## 2022-03-21 HISTORY — DX: Family history of malignant neoplasm of breast: Z80.3

## 2022-03-21 NOTE — Progress Notes (Addendum)
REFERRING PROVIDER: ?Candida Peeling, PA-C ?[address] ?[place],  [place] [address] ? ?PRIMARY PROVIDER:  ?Karlton Lemon, PA-C ? ?PRIMARY REASON FOR VISIT:  ?1. Family history of breast cancer   ? ? ?HISTORY OF PRESENT ILLNESS:   ?Leah Burns, a 31 y.o. female, was seen for a [facility] cancer genetics consultation at the request of Dr. Royce Macadamia due to a family history of cancer.  Leah Burns presents to clinic today to discuss the possibility of a hereditary predisposition to cancer, to discuss genetic testing, and to further clarify her future cancer risks, as well as potential cancer risks for family members.  ? ?Leah Burns is a 31 y.o. female with no personal history of cancer.   ? ?CANCER HISTORY:  ?Oncology History  ? No history exists.  ? ? ?RISK FACTORS:  ?Mammogram within the last year: no ?Number of breast biopsies: 1; ~2015; benign per patient ?Colonoscopy: yes;  most recent in 2021; less than 5 colon polyps . ?Hysterectomy: no.  ?Ovaries intact: yes; salpingectomy  ?Menarche was at age 27.  ?First live birth at age 20.  ?OCP use for approximately 1 years.  ?HRT use: 0 years. ? ?Past Medical History:  ?Diagnosis Date  ? Asthma   ? Endometriosis   ? Family history of breast cancer 03/21/2022  ? ? ? ?FAMILY HISTORY:  ?We obtained a detailed, 4-generation family history.  Significant diagnoses are listed below: ?Family History  ?Problem Relation Age of Onset  ? Breast cancer Mother   ?     dx 3s  ? Breast cancer Maternal Grandmother   ?     dx 16s  ? Lung cancer Maternal Grandmother 72  ? Lung cancer Maternal Grandfather   ? Cancer Paternal Grandfather   ?     unknown type; ? prostate; ? colon; dx after 35  ? Breast cancer Maternal Great-grandmother   ?     MGM's mother  ? Breast cancer Other   ?     MGM's sister; dx 42s  ? Bone cancer Other   ?     MGF's brothers, x2  ? ? ?Leah Burns is unaware of previous family history of genetic testing for hereditary cancer risks.  There is no reported  Ashkenazi Jewish ancestry. There is no known consanguinity. ? ?GENETIC COUNSELING ASSESSMENT: Leah Burns is a 31 y.o. female with a family history of cancer which is somewhat suggestive of a hereditary cancer syndrome and predisposition to cancer given the presence of related cancers in the family. We, therefore, discussed and recommended the following at today's visit.  ? ?DISCUSSION: We discussed that 5 - 10% of cancer is hereditary, with most cases of hereditary breast cancer associated with mutations in BRCA1/2.  There are other genes that can be associated with hereditary breast cancer syndromes.  We discussed that testing is beneficial for several reasons, including knowing about other cancer risks, identifying potential screening and risk-reduction options that may be appropriate, and to understanding if other family members could be at risk for cancer and allowing them to undergo genetic testing. ? ?We reviewed the characteristics, features and inheritance patterns of hereditary cancer syndromes. We also discussed genetic testing, including the appropriate family members to test, the process of testing, insurance coverage and turn-around-time for results. We discussed the implications of a negative, positive, carrier and/or variant of uncertain significant result. We discussed that negative results would be uninformative given that Leah Burns does not have a personal history of  cancer. We recommended Leah Burns pursue genetic testing for a panel that contains genes associated with breast. ? ?Leah Burns was offered a common hereditary cancer panel (47 genes) and an expanded pan-cancer panel (77 genes). Leah Burns was informed of the benefits and limitations of each panel, including that expanded pan-cancer panels contain several genes that do not have clear management guidelines at this point in time.  We also discussed that as the number of genes included on a panel increases, the chances of  variants of uncertain significance increases.  After considering the benefits and limitations of each gene panel, Leah Burns elected to have an expanded pan-cancer panel through [place]. ? ?The CancerNext-Expanded gene panel offered by [facility] and includes sequencing, rearrangement, and RNA analysis for the following 77 genes: AIP, ALK, APC, ATM, AXIN2, BAP1, BARD1, BLM, BMPR1A, BRCA1, BRCA2, BRIP1, CDC73, CDH1, CDK4, CDKN1B, CDKN2A, CHEK2, CTNNA1, DICER1, FANCC, FH, FLCN, GALNT12, KIF1B, LZTR1, MAX, MEN1, MET, MLH1, MSH2, MSH3, MSH6, MUTYH, NBN, NF1, NF2, NTHL1, PALB2, PHOX2B, PMS2, POT1, PRKAR1A, PTCH1, PTEN, RAD51C, RAD51D, RB1, RECQL, RET, SDHA, SDHAF2, SDHB, SDHC, SDHD, SMAD4, SMARCA4, SMARCB1, SMARCE1, STK11, SUFU, TMEM127, TP53, TSC1, TSC2, VHL and XRCC2 (sequencing and deletion/duplication); EGFR, EGLN1, HOXB13, KIT, MITF, PDGFRA, POLD1, and POLE (sequencing only); EPCAM and GREM1 (deletion/duplication only).  ? ?Based on Leah Burns's family history of cancer, she meets medical criteria for genetic testing.  ? ?We discussed that some people do not want to undergo genetic testing due to fear of genetic discrimination.  A federal law called the Genetic Information Non-Discrimination Act (GINA) of 2008 helps protect individuals against genetic discrimination based on their genetic test results.  It impacts both health insurance and employment.  With health insurance, it protects against increased premiums, being kicked off insurance or being forced to take a test in order to be insured.  For employment it protects against hiring, firing and promoting decisions based on genetic test results.  GINA does not apply to those in the [employer], those who work for companies with less than 15 employees, and new life insurance or long-term disability insurance policies.  Health status due to a cancer diagnosis is not protected under GINA. ? ?PLAN: After considering the risks, benefits, and limitations, Ms.  Demo provided informed consent to pursue genetic testing and the blood sample was sent to [facility] for analysis of the CancerNext-Expanded +RNAinsight Panel. Results should be available within approximately 3 weeks' time, at which point they will be disclosed by telephone to Ms. Fitzmaurice, as will any additional recommendations warranted by these results. Ms. Kessen will receive a summary of her genetic counseling visit and a copy of her results once available. This information will also be available in Epic.  ? 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  Requested additional information about this for reproductive decision making.  Will request referring provide refer to [facility] Prenatal [facility] for appropriate counseling.  ? ?Lastly, we encouraged Ms. Enerson to remain in contact with cancer genetics annually so that we can continuously update the family history and inform her of any changes in cancer genetics and testing that may be of benefit for this family.  ? ?Ms. Nazareno's questions were answered to her satisfaction today. Our contact information was provided should additional questions or concerns arise. Thank you for the referral and allowing [place] to share in the care of your patient.  ? ?Cari M. Joette Catching, [place], [place] ?Genetic Counselor ?[email or web address] ?(P) [phone]  ? ?  The patient was seen for a total of 30 minutes in face-to-face genetic counseling.  The patient was seen alone.  Drs. Lindi Adie and/or Burr Medico were available to discuss this case as needed.  ?_______________________________________________________________________ ?For Office Staff:  ?Number of people involved in session: 1 ?Was an Intern/ student involved with case: no ? ? ?

## 2022-04-07 ENCOUNTER — Encounter: Payer: Self-pay | Admitting: Genetic Counselor

## 2022-04-07 ENCOUNTER — Ambulatory Visit: Payer: Self-pay | Admitting: Genetic Counselor

## 2022-04-07 ENCOUNTER — Telehealth: Payer: Self-pay | Admitting: Genetic Counselor

## 2022-04-07 DIAGNOSIS — Z1379 Encounter for other screening for genetic and chromosomal anomalies: Secondary | ICD-10-CM

## 2022-04-07 DIAGNOSIS — Z803 Family history of malignant neoplasm of breast: Secondary | ICD-10-CM

## 2022-04-07 NOTE — Telephone Encounter (Signed)
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  Discussed not known to be at increased risk for breast cancer based on this result.  Discussed family implications.  Lifeitme breast cancer risk estimated to be over 20% based on hormonal/family history information.  Patient interested in referral to high risk breast clinic.  ? ? ?

## 2022-04-13 ENCOUNTER — Encounter: Payer: Self-pay | Admitting: Genetic Counselor

## 2022-04-13 NOTE — Progress Notes (Signed)
HPI:   ?Ms. Firebaugh was previously seen in the [facility] clinic due to a family history of breast cancer and concerns regarding a hereditary predisposition to cancer. Please refer to our prior cancer genetics clinic note for more information regarding our discussion, assessment and recommendations, at the time. Ms. Arena recent genetic test results were disclosed to her, as were recommendations warranted by these results. These results and recommendations are discussed in more detail below. ? ?CANCER HISTORY:  ?Oncology History  ? No history exists.  ? ? ?FAMILY HISTORY:  ?We obtained a detailed, 4-generation family history.  Significant diagnoses are listed below: ?Family History  ?Problem Relation Age of Onset  ? Breast cancer Mother   ?     dx 14s  ? Melanoma Mother 26  ?     back of arm  ? Breast cancer Maternal Grandmother   ?     dx 33s  ? Lung cancer Maternal Grandmother 42  ? Lung cancer Maternal Grandfather   ? Cancer Paternal Grandfather   ?     unknown type; ? prostate; ? colon; dx after 83  ? Breast cancer Maternal Great-grandmother   ?     MGM's mother  ? Breast cancer Other   ?     MGM's sister; dx 39s  ? Bone cancer Other   ?     MGF's brothers, x2  ? ? ? ?Ms. [place] is unaware of previous family history of genetic testing for hereditary cancer risks.  There is no reported Ashkenazi Jewish ancestry. There is no known consanguinity. ?  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  This means that Ms. 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  ? ? No other pathogenic variants were detected.  The CancerNext-Expanded gene panel offered by [facility] and includes sequencing, rearrangement, and RNA analysis for the following 77 genes: AIP, ALK, APC, ATM, AXIN2, BAP1, BARD1, BLM, BMPR1A, BRCA1, BRCA2,  BRIP1, CDC73, CDH1, CDK4, CDKN1B, CDKN2A, CHEK2, CTNNA1, DICER1, FANCC, FH, FLCN, GALNT12, KIF1B, LZTR1, MAX, MEN1, MET, MLH1, MSH2, MSH3, MSH6, MUTYH, NBN, NF1, NF2, NTHL1, PALB2, PHOX2B, PMS2, POT1, PRKAR1A, PTCH1, PTEN, RAD51C, RAD51D, RB1, RECQL, RET, SDHA, SDHAF2, SDHB, SDHC, SDHD, SMAD4, SMARCA4, SMARCB1, SMARCE1, STK11, SUFU, TMEM127, TP53, TSC1, TSC2, VHL and XRCC2 (sequencing and deletion/duplication); EGFR, EGLN1, HOXB13, KIT, MITF, PDGFRA, POLD1, and POLE (sequencing only); EPCAM and GREM1 (deletion/duplication only).  ?  ? ?The test report has been scanned into EPIC and is located under the Molecular Pathology section of the Results Review tab.  A portion of the result report is included below for reference. Genetic testing reported out on April 04, 2022.  ? ? ? ? ?Of note, the single pathogenic variant in NBN does not explain the family history of cancer. Possible explanations for the cancer in the family may include: ?There may be no hereditary risk for cancer in the family. The cancers in Ms. Schult's family may be sporadic/familial or due to other genetic and environmental factors. ?There may be a gene mutation in one of these genes that current testing methods cannot detect but that chance is small. ?There could be another gene that has not yet been discovered, or that we have not yet tested, that is responsible for the cancer diagnoses in the family.  ?It is also possible there is a hereditary cause for  the cancer in the family that Ms. Szczygiel did not inherit. ? ?Therefore, it is important to remain in touch with cancer genetics in the future so that we can continue to offer Ms. Rudin the most up to date genetic testing.  ? ? ?NBN CARRIER:  ?Having a single, heterozygous mutation in the NBN gene (being an NBN carrier) was previously thought to be associated with an increased female breast cancer risk. However, information has become available that shows that being an NBN carrier is not  associated with higher breast cancer risk.  There is insufficient evidence to recommend specialized cancer screening for NBN carriers. Screening recommendations should be based on personal and family history.  ? ?Individuals with two, biallelic mutations in the NBN gene (one inherited from each parent) have a genetic condition called [place]ij[place] breakage syndrome. Since she has only one mutation in the NBN gene, Ms. Teall is NOT affected with Nijmegen breakage syndrome but instead is a carrier.  Nijmegen breakage syndrome is  characterized by progressive microcephaly, growth deficiency, recurrent respiratory infections, an increased risk for malignancy (primarily lymphoma).   ? ?We reviewed the implications and testing recommendations for her family members and discussed that first degree relatives also have a 50% chance of being a carrier for NBN.  Prenatal/preconception genetic counseling is available for those who wish to know their NBN status to estimate their chances of having a child affected with Nijmegen breakage syndrome.  ? ? ?ADDITIONAL GENETIC TESTING:  ?We discussed with Ms. Hanif that her genetic testing was fairly extensive.  If there are additional relevant genes identified to increase cancer risk that can be analyzed in the future, we would be happy to discuss and coordinate this testing at that time.   ? ? ?CANCER SCREENING RECOMMENDATIONS:  ?We have not identified a hereditary cause for Ms. Kracke's family history of cancer at this time.  ? ?An individual's cancer risk and medical management are not determined by genetic test results alone. Overall cancer risk assessment incorporates additional factors, including personal medical history, family history, and any available genetic information that may result in a personalized plan for cancer prevention and surveillance. Therefore, it is recommended she continue to follow the cancer management and screening guidelines provided by her primary  healthcare provider. ? ?Ms. Bells has been determined to be at high risk for breast cancer.  Her lifetime risk for breast cancer based on Tyrer-Cuzick risk score is 22.8%.  This risk estimate can change over time and may be repeated to reflect new information in her personal or family history in the future.  For women with a greater than 20% lifetime risk of breast cancer, the [employer]  (NCCN) recommends the following:  ?Clinical encounter every 6-12 months to begin when identified as being at increased risk, but not before age 70  ?Annual mammograms. Tomosynthesis is recommended starting 10 years earlier than the youngest breast cancer diagnosis in the family or at age 12 (whichever comes first), but not before age 51   ?Annual breast MRI starting 10 years earlier than the youngest breast cancer diagnosis in the family or at age 34 (whichever comes first), but not before age 73  ? ? ?  ?We, therefore, discussed that it is reasonable for Ms. Eichorn to be followed by a high-risk breast cancer clinic; in addition to a yearly mammogram and physical exam by a healthcare provider, she should discuss the usefulness of an annual breast MRI with her referring provider and/or the  high-risk clinic providers.  Ms. Trigo is interested in a referral to the high risk breast clinic, and a referral has been placed.  ? ? ?RECOMMENDATIONS FOR FAMILY MEMBERS:   ?First degree relatives have a 50% chance of also being an NBN carrier.  Prenatal or preconception genetic counseling is availbale for those who wish to learn their chances of having a child with Nijmegen breakage syndrome.  ?Individuals in this family might be at some increased risk of developing cancer, over the general population risk, due to the family history of cancer.  Individuals in the family should notify their providers of the family history of cancer. We recommend women in this family have a yearly mammogram beginning at age 36,  or 63 years younger than the earliest onset of cancer, an annual clinical breast exam, and perform monthly breast self-exams.   ?Other members of the family may still carry a pathogenic variant in one of the

## 2022-04-14 ENCOUNTER — Telehealth: Payer: Self-pay | Admitting: Hematology and Oncology

## 2022-04-14 NOTE — Telephone Encounter (Signed)
Scheduled appt per 5/4 referral. Pt is aware of appt date and time. Pt is aware to arrive 15 mins prior to appt time and to bring and updated insurance card. Pt is aware of appt location.   ?

## 2022-04-24 NOTE — Progress Notes (Addendum)
[facility] CONSULT NOTE  Patient Care Team: [name] as PCP - General (Physician Assistant)  CHIEF COMPLAINTS/PURPOSE OF CONSULTATION:  At high risk for breast cancer  HISTORY OF PRESENTING ILLNESS:  Leah Burns 31 y.o. female is here because of recent diagnosis of high risk for breast cancer.  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  She was referred to [place] because her lifetime risk of breast cancer came up as 22.8%.  She tells me that her mother and her grandmother both were diagnosed with breast cancer in their 30s.  She has never had a mammogram and does not have any pain or discomfort in the breast.  I reviewed her records extensively and collaborated the history with the patient.     MEDICAL HISTORY:  Past Medical History:  Diagnosis Date   Asthma    Endometriosis    Family history of breast cancer 03/21/2022   MS (multiple sclerosis) (HCC)     SURGICAL HISTORY: Past Surgical History:  Procedure Laterality Date   ABDOMINAL HYSTERECTOMY     partial   BREAST REDUCTION SURGERY Bilateral 09/14/2020   Procedure: BILATERAL MAMMARY REDUCTION  (BREAST);  Surgeon: Allena Napoleon, MD;  Location: [facility];  Service: Plastics;  Laterality: Bilateral;   DILATION AND EVACUATION      SOCIAL HISTORY: Social History   Socioeconomic History   Marital status: Single    Spouse name: Not on file   Number of children: Not on file   Years of education: Not on file   Highest education level: Not on file  Occupational History   Not on file  Tobacco Use   Smoking status: Never   Smokeless tobacco: Never  Vaping Use   Vaping Use: Never used  Substance and Sexual Activity   Alcohol use: Yes    Alcohol/week: 1.0 standard drink    Types: 1 Glasses of wine per week    Comment: occas-wine   Drug use: Never   Sexual activity: Yes     Birth control/protection: Injection  Other Topics Concern   Not on file  Social History Narrative   Tea- 2 times per week   Right handed   Social Determinants of Health   Financial Resource Strain: Not on file  Food Insecurity: Not on file  Transportation Needs: Not on file  Physical Activity: Not on file  Stress: Not on file  Social Connections: Not on file  Intimate Partner Violence: Not on file    FAMILY HISTORY: Family History  Problem Relation Age of Onset   Healthy Mother    Macular degeneration Mother    Breast cancer Mother        dx 70s   Melanoma Mother        back of arm   Hypertension Father    High Cholesterol Father    Breast cancer Maternal Grandmother        dx 30s   Lung cancer Maternal Grandmother 59   Lung cancer Maternal Grandfather    Cancer Paternal Grandfather        unknown type; ? prostate; ? colon; dx after 50   Breast cancer Maternal Great-grandmother        MGM's mother   Breast cancer Other        MGM's sister; dx 61s   Bone cancer Other  MGF's brothers, x2    ALLERGIES:  has No Known Allergies.  MEDICATIONS:  Current Outpatient Medications  Medication Sig Dispense Refill   albuterol (PROVENTIL HFA;VENTOLIN HFA) 108 (90 BASE) MCG/ACT inhaler Inhale 1-2 puffs into the lungs every 6 (six) hours as needed for wheezing or shortness of breath. 1 Inhaler 0   methylPREDNISolone (MEDROL) 4 MG tablet Taper from 6 pills po for one day to 1 pill po the last day over 6 days 21 tablet 0   omeprazole (PRILOSEC) 40 MG capsule Take 40 mg by mouth 2 (two) times daily.     ondansetron (ZOFRAN) 4 MG tablet Take 1 tablet (4 mg total) by mouth every 8 (eight) hours as needed for nausea or vomiting. 20 tablet 0   rizatriptan (MAXALT) 10 MG tablet Take 1 tablet (10 mg total) by mouth as needed for migraine. May repeat in 2 hours if needed 10 tablet 5   sertraline (ZOLOFT) 50 MG tablet Take 1 tablet by mouth daily.     No current  facility-administered medications for this visit.    REVIEW OF SYSTEMS:   Constitutional: Denies fevers, chills or abnormal night sweats   All other systems were reviewed with the patient and are negative.  PHYSICAL EXAMINATION: ECOG PERFORMANCE STATUS: 0 - Asymptomatic  Vitals:   05/03/22 1331  BP: 125/80  Pulse: 87  Resp: 18  Temp: 97.9 F (36.6 C)  SpO2: 100%   Filed Weights   05/03/22 1331  Weight: 134 lb 4.8 oz (60.9 kg)     LABORATORY DATA:  I have reviewed the data as listed Lab Results  Component Value Date   WBC 6.7 12/29/2021   HGB 12.3 12/29/2021   HCT 38.3 12/29/2021   MCV 86 12/29/2021   PLT 174 12/29/2021   Lab Results  Component Value Date   NA 135 04/07/2021   K 3.5 04/07/2021   CL 104 04/07/2021   CO2 21 (L) 04/07/2021    RADIOGRAPHIC STUDIES: I have personally reviewed the radiological reports and agreed with the findings in the report.  ASSESSMENT AND PLAN:  At high risk for breast cancer 1.  Risk assessment: ABaker Janus model: 5-year risk:: 1.1% (average risk 0.3%) B.  Tyrer-Cuzick model: 10-year risk: 1.4% (average risk 0.7%) C.  Tyrer-Cuzick model: Lifetime risk 22.5% (average risk 10.5%)  2. Risk reduction: A.  Pharmacological risk reduction: Tamoxifen versus raloxifene: She does not benefit from antiestrogen therapy given that the [facility] risk is only 1.1% B.  Nonpharmacological risk reduction: Stress importance of eating healthy, diet, exercise, supplements like vitamin D and turmeric, avoiding alcohol  3.  Breast cancer surveillance: A.  Mammograms annually B.  Breast MRIs annually   Return to clinic in 1 year for follow-up with a MyChart virtual visit   All questions were answered. The patient knows to call the clinic with any problems, questions or concerns.    Harriette Ohara, MD 05/03/22  I Gardiner Coins am scribing for Dr. Lindi Adie  I have reviewed the above documentation for accuracy and completeness, and I agree with  the above.

## 2022-05-03 ENCOUNTER — Inpatient Hospital Stay: Payer: Medicaid Other | Attending: Genetic Counselor | Admitting: Hematology and Oncology

## 2022-05-03 ENCOUNTER — Other Ambulatory Visit: Payer: Self-pay

## 2022-05-03 DIAGNOSIS — Z9189 Other specified personal risk factors, not elsewhere classified: Secondary | ICD-10-CM | POA: Insufficient documentation

## 2022-05-03 DIAGNOSIS — G35 Multiple sclerosis: Secondary | ICD-10-CM | POA: Diagnosis not present

## 2022-05-03 DIAGNOSIS — Z803 Family history of malignant neoplasm of breast: Secondary | ICD-10-CM | POA: Diagnosis not present

## 2022-05-03 DIAGNOSIS — Z79899 Other long term (current) drug therapy: Secondary | ICD-10-CM | POA: Diagnosis not present

## 2022-05-03 NOTE — Assessment & Plan Note (Signed)
1.  Risk assessment: A.  Gail model: 5-year risk B.  Tyrer-Cuzick model: 10-year risk C.  Tyrer-Cuzick model: Lifetime risk  2. Risk reduction: A.  Pharmacological risk reduction: Tamoxifen versus raloxifene B.  Nonpharmacological risk reduction: Stress importance of eating healthy, diet, exercise, supplements like vitamin D and turmeric, avoiding alcohol  3.  Breast cancer surveillance: A.  Mammograms annually B.  Breast MRIs annually versus every 2 to 3 years  Return to clinic in 1 year for follow-up  

## 2022-05-04 ENCOUNTER — Telehealth: Payer: Self-pay | Admitting: Hematology and Oncology

## 2022-05-04 NOTE — Telephone Encounter (Signed)
Scheduled appointment per 5/24 los. Patient is aware.

## 2022-06-14 ENCOUNTER — Ambulatory Visit
Admission: RE | Admit: 2022-06-14 | Discharge: 2022-06-14 | Disposition: A | Discharge status: Home / Self Care | Payer: Medicaid Other | Source: Ambulatory Visit | Attending: Hematology and Oncology | Admitting: Hematology and Oncology

## 2022-06-14 DIAGNOSIS — Z9189 Other specified personal risk factors, not elsewhere classified: Secondary | ICD-10-CM

## 2022-08-13 ENCOUNTER — Emergency Department (HOSPITAL_COMMUNITY)
Admission: EM | Admit: 2022-08-13 | Discharge: 2022-08-13 | Disposition: A | Discharge status: Home / Self Care | Payer: Medicaid Other | Attending: Emergency Medicine | Admitting: Emergency Medicine

## 2022-08-13 ENCOUNTER — Encounter (HOSPITAL_COMMUNITY): Payer: Self-pay | Admitting: Emergency Medicine

## 2022-08-13 ENCOUNTER — Other Ambulatory Visit: Payer: Self-pay

## 2022-08-13 DIAGNOSIS — R202 Paresthesia of skin: Secondary | ICD-10-CM | POA: Insufficient documentation

## 2022-08-13 DIAGNOSIS — H538 Other visual disturbances: Secondary | ICD-10-CM | POA: Insufficient documentation

## 2022-08-13 DIAGNOSIS — T44905A Adverse effect of unspecified drugs primarily affecting the autonomic nervous system, initial encounter: Secondary | ICD-10-CM

## 2022-08-13 LAB — CBC WITH DIFFERENTIAL/PLATELET
Abs Immature Granulocytes: 0.02 10*3/uL (ref 0.00–0.07)
Basophils Absolute: 0 10*3/uL (ref 0.0–0.1)
Basophils Relative: 0 %
Eosinophils Absolute: 0.1 10*3/uL (ref 0.0–0.5)
Eosinophils Relative: 1 %
HCT: 42.2 % (ref 36.0–46.0)
Hemoglobin: 13.7 g/dL (ref 12.0–15.0)
Immature Granulocytes: 0 %
Lymphocytes Relative: 32 %
Lymphs Abs: 2.7 10*3/uL (ref 0.7–4.0)
MCH: 27.6 pg (ref 26.0–34.0)
MCHC: 32.5 g/dL (ref 30.0–36.0)
MCV: 85.1 fL (ref 80.0–100.0)
Monocytes Absolute: 0.4 10*3/uL (ref 0.1–1.0)
Monocytes Relative: 5 %
Neutro Abs: 5.1 10*3/uL (ref 1.7–7.7)
Neutrophils Relative %: 62 %
Platelets: 203 10*3/uL (ref 150–400)
RBC: 4.96 MIL/uL (ref 3.87–5.11)
RDW: 14.1 % (ref 11.5–15.5)
WBC: 8.4 10*3/uL (ref 4.0–10.5)
nRBC: 0 % (ref 0.0–0.2)

## 2022-08-13 LAB — COMPREHENSIVE METABOLIC PANEL
ALT: 10 U/L (ref 0–44)
AST: 18 U/L (ref 15–41)
Albumin: 4.8 g/dL (ref 3.5–5.0)
Alkaline Phosphatase: 42 U/L (ref 38–126)
Anion gap: 7 (ref 5–15)
BUN: 16 mg/dL (ref 6–20)
CO2: 23 mmol/L (ref 22–32)
Calcium: 9.3 mg/dL (ref 8.9–10.3)
Chloride: 111 mmol/L (ref 98–111)
Creatinine, Ser: 0.93 mg/dL (ref 0.44–1.00)
GFR, Estimated: >60 mL/min (ref >60)
Glucose, Bld: 111 mg/dL — ABNORMAL HIGH (ref 70–99)
Potassium: 3.4 mmol/L — ABNORMAL LOW (ref 3.5–5.1)
Sodium: 141 mmol/L (ref 135–145)
Total Bilirubin: 1.6 mg/dL — ABNORMAL HIGH (ref 0.3–1.2)
Total Protein: 7.5 g/dL (ref 6.5–8.1)

## 2022-08-13 LAB — RAPID URINE DRUG SCREEN, HOSP PERFORMED
Amphetamines: NOT DETECTED
Barbiturates: NOT DETECTED
Benzodiazepines: NOT DETECTED
Cocaine: NOT DETECTED
Opiates: NOT DETECTED
Tetrahydrocannabinol: POSITIVE — AB
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
## 2022-08-13 NOTE — Discharge Instructions (Signed)
Drink plenty of liquids, get plenty of rest, return here for any problems such as persistent dizziness, vomiting, or any other problems

## 2022-08-13 NOTE — ED Provider Notes (Signed)
[facility] DEPT Provider Note   CSN: [id number] Arrival date & time: 08/13/22  1237     History  Chief Complaint  Patient presents with   Blurred Vision   Dizziness    Leah Burns is a 31 y.o. female.  31 year old female presents after possible exposure to unknown illicit drug.  Patient states that she went to a club yesterday evening and was possibly slipped something.  Notes that she was drinking tequila and it was only her second shot.  Fully thereafter she began to have blurred vision and by numbness.  States when she got home she passed out she did have loss of bladder function.  Presents today she has had some blurred vision.  Denies any other ingestions at this time.  Does not take any medications currently.  Denies any focal weakness.       Home Medications Prior to Admission medications   Medication Sig Start Date End Date Taking? Authorizing Provider  albuterol (PROVENTIL HFA;VENTOLIN HFA) 108 (90 BASE) MCG/ACT inhaler Inhale 1-2 puffs into the lungs every 6 (six) hours as needed for wheezing or shortness of breath. 04/24/15   Jaclyn Prime, Collene Leyden, PA-C  methylPREDNISolone (MEDROL) 4 MG tablet Taper from 6 pills po for one day to 1 pill po the last day over 6 days 12/29/21   Sater, Nanine Means, MD  omeprazole (PRILOSEC) 40 MG capsule Take 40 mg by mouth 2 (two) times daily. 02/22/21   [provider]  ondansetron (ZOFRAN) 4 MG tablet Take 1 tablet (4 mg total) by mouth every 8 (eight) hours as needed for nausea or vomiting. 09/02/20   Scheeler, Carola Rhine, PA-C  rizatriptan (MAXALT) 10 MG tablet Take 1 tablet (10 mg total) by mouth as needed for migraine. May repeat in 2 hours if needed 12/28/20   Sater, Nanine Means, MD  sertraline (ZOLOFT) 50 MG tablet Take 1 tablet by mouth daily. 05/17/21   [provider]      Allergies    Patient has no known allergies.    Review of Systems   Review of Systems  All other systems reviewed  and are negative.   Physical Exam Updated Vital Signs BP 136/83   Pulse 89   Temp 98 F (36.7 C) (Oral)   Resp 16   SpO2 98%  Physical Exam Vitals and nursing note reviewed.  Constitutional:      General: She is not in acute distress.    Appearance: Normal appearance. She is well-developed. She is not toxic-appearing.  HENT:     Head: Normocephalic and atraumatic.  Eyes:     General: Lids are normal.     Conjunctiva/sclera: Conjunctivae normal.     Pupils: Pupils are equal, round, and reactive to light.  Neck:     Thyroid: No thyroid mass.     Trachea: No tracheal deviation.  Cardiovascular:     Rate and Rhythm: Normal rate and regular rhythm.     Heart sounds: Normal heart sounds. No murmur heard.    No gallop.  Pulmonary:     Effort: Pulmonary effort is normal. No respiratory distress.     Breath sounds: Normal breath sounds. No stridor. No decreased breath sounds, wheezing, rhonchi or rales.  Abdominal:     General: There is no distension.     Palpations: Abdomen is soft.     Tenderness: There is no abdominal tenderness. There is no rebound.  Musculoskeletal:        General:  No tenderness. Normal range of motion.     Cervical back: Normal range of motion and neck supple.  Skin:    General: Skin is warm and dry.     Findings: No abrasion or rash.  Neurological:     General: No focal deficit present.     Mental Status: She is alert and oriented to person, place, and time. Mental status is at baseline.     GCS: GCS eye subscore is 4. GCS verbal subscore is 5. GCS motor subscore is 6.     Cranial Nerves: No cranial nerve deficit.     Sensory: No sensory deficit.     Motor: Motor function is intact.     Gait: Gait is intact.  Psychiatric:        Attention and Perception: Attention normal.        Speech: Speech normal.        Behavior: Behavior normal.     ED Results / Procedures / Treatments   Labs (all labs ordered are listed, but only abnormal results are  displayed) Labs Reviewed  RAPID URINE DRUG SCREEN, HOSP PERFORMED  CBC WITH DIFFERENTIAL/PLATELET  COMPREHENSIVE METABOLIC PANEL    EKG None  Radiology No results found.  Procedures Procedures    Medications Ordered in ED Medications - No data to display  ED Course/ Medical Decision Making/ A&P                           Medical Decision Making Amount and/or Complexity of Data Reviewed Labs: ordered. ECG/medicine tests: ordered.   Patient's UDS is positive for THC.  Electrolytes are normal limits.  Patient is awake alert and oriented x4.  Do not feel that she needs any further work-up..  Suspect that she took a substance which is likely to have lysis of the her body.  Return precautions given        Final Clinical Impression(s) / ED Diagnoses Final diagnoses:  None    Rx / DC Orders ED Discharge Orders     None         Lacretia Leigh, MD 08/13/22 1455

## 2023-04-13 ENCOUNTER — Telehealth: Payer: Self-pay | Admitting: Hematology and Oncology

## 2023-04-14 NOTE — Progress Notes (Signed)
Patient Care Team: [name] as PCP - General (Physician Assistant)  DIAGNOSIS:  Encounter Diagnosis  Name Primary?   At high risk for breast cancer       CHIEF COMPLIANT: Tender palpable nodule underneath the right breast  INTERVAL HISTORY: Leah Burns is a 32 y.o. female is here with a prior history of being diagnosed at high risk for breast cancer and is here today because she felt a lump underneath the right breast.  This has been ongoing for about few weeks and that she has been unable to lay on her belly because of the pain that it is causing.  She has had breast reduction and it is right below the surgical scar.   ALLERGIES:  has No Known Allergies.  MEDICATIONS:  Current Outpatient Medications  Medication Sig Dispense Refill   albuterol (PROVENTIL HFA;VENTOLIN HFA) 108 (90 BASE) MCG/ACT inhaler Inhale 1-2 puffs into the lungs every 6 (six) hours as needed for wheezing or shortness of breath. 1 Inhaler 0   methylPREDNISolone (MEDROL) 4 MG tablet Taper from 6 pills po for one day to 1 pill po the last day over 6 days 21 tablet 0   omeprazole (PRILOSEC) 40 MG capsule Take 40 mg by mouth 2 (two) times daily.     ondansetron (ZOFRAN) 4 MG tablet Take 1 tablet (4 mg total) by mouth every 8 (eight) hours as needed for nausea or vomiting. 20 tablet 0   rizatriptan (MAXALT) 10 MG tablet Take 1 tablet (10 mg total) by mouth as needed for migraine. May repeat in 2 hours if needed 10 tablet 5   sertraline (ZOLOFT) 50 MG tablet Take 1 tablet by mouth daily.     No current facility-administered medications for this visit.    PHYSICAL EXAMINATION: ECOG PERFORMANCE STATUS: 1 - Symptomatic but completely ambulatory  Vitals:   04/20/23 1119  BP: 131/74  Pulse: (!) 111  Resp: 16  Temp: 100 F (37.8 C)  SpO2: 100%   Filed Weights   04/20/23 1119  Weight: 134 lb 1.6 oz (60.8 kg)    BREAST: Small tender palpable lump underneath the right breast below the  surgical scar (exam performed in the presence of a chaperone)  LABORATORY DATA:  I have reviewed the data as listed    Latest Ref Rng & Units 08/13/2022    1:30 PM 04/07/2021   12:39 PM 12/15/2020    8:33 AM  CMP  Glucose 70 - 99 mg/dL 161  93  096   BUN 6 - 20 mg/dL 16  12  14    Creatinine 0.44 - 1.00 mg/dL 0.45  4.09  8.11   Sodium 135 - 145 mmol/L 141  135  139   Potassium 3.5 - 5.1 mmol/L 3.4  3.5  3.9   Chloride 98 - 111 mmol/L 111  104  110   CO2 22 - 32 mmol/L 23  21  22    Calcium 8.9 - 10.3 mg/dL 9.3  9.9  8.8   Total Protein 6.5 - 8.1 g/dL 7.5  8.1    Total Bilirubin 0.3 - 1.2 mg/dL 1.6  0.9    Alkaline Phos 38 - 126 U/L 42  37    AST 15 - 41 U/L 18  21    ALT 0 - 44 U/L 10  12      Lab Results  Component Value Date   WBC 8.4 08/13/2022   HGB 13.7 08/13/2022   HCT 42.2 08/13/2022  MCV 85.1 08/13/2022   PLT 203 08/13/2022   NEUTROABS 5.1 08/13/2022    ASSESSMENT & PLAN:  At high risk for breast cancer 1.  Risk assessment: ADondra Spry model: 5-year risk:: 1.1% (average risk 0.3%) B.  Tyrer-Cuzick model: 10-year risk: 1.4% (average risk 0.7%) C.  Tyrer-Cuzick model: Lifetime risk 22.5% (average risk 10.5%)  Painful lump under right breast: I recommended that we obtain her ultrasound evaluation.  I will send request to the breast center for this.  We can also order the ultrasound-guided biopsy if there was an abnormality.  I discussed with her that it could be fat necrosis.  Breast cancer surveillance: Mammogram 06/15/2022: Benign breast density category B Breast exam: Tender palpable lump below the right breast.  Recommend that she get a mammogram in July and breast MRI 6 months from that point. If she does not want to do breast MRIs I discussed with her about contrast-enhanced mammograms as another option. We will see her back in 1 year for follow-up.  No orders of the defined types were placed in this encounter.  The patient has a good understanding of the overall  plan. she agrees with it. she will call with any problems that may develop before the next visit here. Total time spent: 30 mins including face to face time and time spent for planning, charting and co-ordination of care   Tamsen Meek, MD 04/20/23    I Janan Ridge am acting as a Neurosurgeon for [employer]  I have reviewed the above documentation for accuracy and completeness, and I agree with the above.

## 2023-04-20 ENCOUNTER — Other Ambulatory Visit: Payer: Self-pay | Admitting: Hematology and Oncology

## 2023-04-20 ENCOUNTER — Other Ambulatory Visit: Payer: Self-pay | Admitting: *Deleted

## 2023-04-20 ENCOUNTER — Inpatient Hospital Stay: Payer: Medicaid Other | Attending: Hematology and Oncology | Admitting: Hematology and Oncology

## 2023-04-20 ENCOUNTER — Other Ambulatory Visit: Payer: Self-pay

## 2023-04-20 DIAGNOSIS — N63 Unspecified lump in unspecified breast: Secondary | ICD-10-CM

## 2023-04-20 DIAGNOSIS — Z9189 Other specified personal risk factors, not elsewhere classified: Secondary | ICD-10-CM

## 2023-04-20 DIAGNOSIS — N6310 Unspecified lump in the right breast, unspecified quadrant: Secondary | ICD-10-CM | POA: Insufficient documentation

## 2023-04-20 NOTE — Assessment & Plan Note (Addendum)
1.  Risk assessment: ADondra Spry model: 5-year risk:: 1.1% (average risk 0.3%) B.  Tyrer-Cuzick model: 10-year risk: 1.4% (average risk 0.7%) C.  Tyrer-Cuzick model: Lifetime risk 22.5% (average risk 10.5%)  Painful lump under right breast:  Breast cancer surveillance: Mammogram 06/15/2022: Benign breast density category B Breast exam:

## 2023-04-23 ENCOUNTER — Telehealth: Payer: Self-pay | Admitting: Hematology and Oncology

## 2023-04-23 NOTE — Telephone Encounter (Signed)
Scheduled appointment per 5/10 los. Patient is aware. ?

## 2023-04-24 ENCOUNTER — Ambulatory Visit
Admission: RE | Admit: 2023-04-24 | Discharge: 2023-04-24 | Disposition: A | Discharge status: Home / Self Care | Payer: Medicaid Other | Source: Ambulatory Visit | Attending: Hematology and Oncology | Admitting: Hematology and Oncology

## 2023-04-24 ENCOUNTER — Other Ambulatory Visit: Payer: Self-pay | Admitting: Hematology and Oncology

## 2023-04-24 DIAGNOSIS — Z9189 Other specified personal risk factors, not elsewhere classified: Secondary | ICD-10-CM

## 2023-04-24 DIAGNOSIS — N63 Unspecified lump in unspecified breast: Secondary | ICD-10-CM

## 2023-04-27 ENCOUNTER — Inpatient Hospital Stay (HOSPITAL_BASED_OUTPATIENT_CLINIC_OR_DEPARTMENT_OTHER): Payer: Medicaid Other | Admitting: Adult Health

## 2023-04-27 DIAGNOSIS — Z91199 Patient's noncompliance with other medical treatment and regimen due to unspecified reason: Secondary | ICD-10-CM

## 2023-04-27 NOTE — Progress Notes (Signed)
Attempted to call patient x 2.  She did not answer.  LMOM.  Will need to reschedule.   Lillard Anes, NP 04/27/23 3:19 PM Medical Oncology and Hematology [facility] [address] [place], [place] [address] Tel. [phone]    Fax. [phone]

## 2023-04-30 ENCOUNTER — Ambulatory Visit
Admission: RE | Admit: 2023-04-30 | Discharge: 2023-04-30 | Disposition: A | Discharge status: Home / Self Care | Payer: Medicaid Other | Source: Ambulatory Visit | Attending: Hematology and Oncology | Admitting: Hematology and Oncology

## 2023-04-30 DIAGNOSIS — N63 Unspecified lump in unspecified breast: Secondary | ICD-10-CM

## 2023-04-30 HISTORY — PX: BREAST BIOPSY: SHX20

## 2023-05-04 ENCOUNTER — Telehealth: Payer: Self-pay

## 2023-05-04 NOTE — Telephone Encounter (Signed)
Pt called concerned about path results from bx. Explained to pt what hyperplasia means and let her know she will consult with Dr Luisa Hart to decide if surgical intervention is necessary or recommended. She verbalized understanding and was thankful for breakdown.

## 2023-05-08 ENCOUNTER — Telehealth: Payer: Medicaid Other | Admitting: Hematology and Oncology

## 2023-05-21 ENCOUNTER — Ambulatory Visit: Payer: Self-pay | Admitting: Surgery

## 2023-10-30 ENCOUNTER — Encounter (HOSPITAL_BASED_OUTPATIENT_CLINIC_OR_DEPARTMENT_OTHER): Payer: Self-pay

## 2023-10-30 ENCOUNTER — Ambulatory Visit (HOSPITAL_BASED_OUTPATIENT_CLINIC_OR_DEPARTMENT_OTHER): Admit: 2023-10-30 | Payer: Medicaid Other | Admitting: Surgery

## 2023-10-30 SURGERY — MASTECTOMY, NIPPLE SPARING
Anesthesia: General | Laterality: Bilateral

## 2023-11-12 ENCOUNTER — Ambulatory Visit (HOSPITAL_COMMUNITY): Admission: RE | Admit: 2023-11-12 | Payer: Medicaid Other | Source: Ambulatory Visit

## 2023-12-03 ENCOUNTER — Ambulatory Visit (HOSPITAL_COMMUNITY)
Admission: RE | Admit: 2023-12-03 | Discharge: 2023-12-03 | Disposition: A | Discharge status: Home / Self Care | Payer: Medicaid Other | Source: Ambulatory Visit | Attending: Hematology and Oncology | Admitting: Hematology and Oncology

## 2023-12-03 DIAGNOSIS — Z9189 Other specified personal risk factors, not elsewhere classified: Secondary | ICD-10-CM | POA: Diagnosis present

## 2023-12-03 MED ORDER — GADOBUTROL 1 MMOL/ML IV SOLN
6.0000 mL | Freq: Once | INTRAVENOUS | Status: AC | PRN
  Administered 2023-12-03: 6 mL via INTRAVENOUS

## 2023-12-07 ENCOUNTER — Telehealth: Payer: Self-pay | Admitting: *Deleted

## 2023-12-07 NOTE — Telephone Encounter (Signed)
-----   Message from Tamsen Meek sent at 12/06/2023  4:42 PM EST ----- Please let her know that her breast MRI is normal. ----- Message ----- From: Interface, Rad Results In Sent: 12/04/2023   8:47 AM EST To: Serena Croissant, MD

## 2023-12-07 NOTE — Telephone Encounter (Signed)
RN attempt x1 to contact pt with below information.  No answer, LVM for pt to return call to the office.

## 2024-04-21 ENCOUNTER — Ambulatory Visit: Payer: Medicaid Other | Admitting: Hematology and Oncology

## 2024-04-23 ENCOUNTER — Inpatient Hospital Stay: Attending: Hematology and Oncology | Admitting: Hematology and Oncology

## 2024-04-23 DIAGNOSIS — Z9189 Other specified personal risk factors, not elsewhere classified: Secondary | ICD-10-CM | POA: Diagnosis present

## 2024-04-23 DIAGNOSIS — D649 Anemia, unspecified: Secondary | ICD-10-CM | POA: Insufficient documentation

## 2024-04-23 DIAGNOSIS — N809 Endometriosis, unspecified: Secondary | ICD-10-CM | POA: Insufficient documentation

## 2024-04-23 NOTE — Progress Notes (Signed)
 Patient Care Team: Shelton, Melissa J, PA-C as PCP - General (Physician Assistant)  DIAGNOSIS:  Encounter Diagnosis  Name Primary?   At high risk for breast cancer Yes   CHIEF COMPLIANT: Surveillance of being at high risk for breast cancer  HISTORY OF PRESENT ILLNESS:  History of Present Illness Leah Burns is a 33 year old female with endometriosis who presents for follow-up regarding her breast health and upcoming hysterectomy.  She experiences low energy levels, likely due to anemia associated with endometriosis. A hysterectomy is scheduled for June 6th to manage this condition. She is at high risk for breast cancer and undergoes regular mammograms and MRIs, with the latest MRI on December 23rd.     ALLERGIES:  has no known allergies.  MEDICATIONS:  No current outpatient medications on file.   No current facility-administered medications for this visit.    PHYSICAL EXAMINATION: ECOG PERFORMANCE STATUS: 1 - Symptomatic but completely ambulatory  Physical Exam No palpable lumps nodules in bilateral breasts or axilla  (exam performed in the presence of a chaperone)  LABORATORY DATA:  I have reviewed the data as listed    Latest Ref Rng & Units 08/13/2022    1:30 PM 04/07/2021   12:39 PM 12/15/2020    8:33 AM  CMP  Glucose 70 - 99 mg/dL 604  93  540   BUN 6 - 20 mg/dL 16  12  14    Creatinine 0.44 - 1.00 mg/dL 9.81  1.91  4.78   Sodium 135 - 145 mmol/L 141  135  139   Potassium 3.5 - 5.1 mmol/L 3.4  3.5  3.9   Chloride 98 - 111 mmol/L 111  104  110   CO2 22 - 32 mmol/L 23  21  22    Calcium  8.9 - 10.3 mg/dL 9.3  9.9  8.8   Total Protein 6.5 - 8.1 g/dL 7.5  8.1    Total Bilirubin 0.3 - 1.2 mg/dL 1.6  0.9    Alkaline Phos 38 - 126 U/L 42  37    AST 15 - 41 U/L 18  21    ALT 0 - 44 U/L 10  12      Lab Results  Component Value Date   WBC 8.4 08/13/2022   HGB 13.7 08/13/2022   HCT 42.2 08/13/2022   MCV 85.1 08/13/2022   PLT 203 08/13/2022   NEUTROABS 5.1  08/13/2022    ASSESSMENT & PLAN:  At high risk for breast cancer .  Risk assessment: AGregary Lean model: 5-year risk:: 1.1% (average risk 0.3%) B.  Tyrer-Cuzick model: 10-year risk: 1.4% (average risk 0.7%) C.  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  (Does not increase risk of breast cancer)  Breast cancer surveillance: Mammogram 04/24/2023: Benign breast density category A Breast exam: Tender palpable lump below the right breast. Breast MRI 12/04/2023: Benign breast density category C ------------------------------------- Assessment and Plan Assessment & Plan Endometriosis Scheduled for hysterectomy on June 6 due to endometriosis-related anemia and low energy.  Anemia Anemia secondary to endometriosis, contributing to low energy.  At high risk for breast cancer Recent MRI normal. Annual MRIs may be excessive due to dye accumulation. Plan to skip MRI this year and resume next year. Mammograms remain normal. - Order screening mammogram  within a month. - She will call breast center at [phone] to schedule mammogram.      Orders Placed This Encounter  Procedures   MM Digital Screening    MCD Pf; 04/24/23@bcg / Epic order    Standing Status:   Future    Expected Date:   05/24/2024    Expiration Date:   04/23/2025    Reason for Exam (SYMPTOM  OR DIAGNOSIS REQUIRED):   Annual mammograms with High risk of breast cancer    Is the patient pregnant?:   No    [facility]erred [facility]    Release to patient:   Immediate   The patient has a good understanding of the overall plan. she agrees with it. she will call with any problems that may develop before the next visit here. Total time spent: 30 mins including face to face  time and time spent for planning, charting and co-ordination of care   Viinay K Terease Marcotte, MD 04/23/24

## 2024-04-23 NOTE — Assessment & Plan Note (Addendum)
.    Risk assessment: AGregary Lean model: 5-year risk:: 1.1% (average risk 0.3%) B.  Tyrer-Cuzick model: 10-year risk: 1.4% (average risk 0.7%) C.  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  (Does not increase risk of breast cancer)  Breast cancer surveillance: Mammogram 04/24/2023: Benign breast density category A Breast exam: Tender palpable lump below the right breast. Breast MRI 12/04/2023: Benign breast density category C

## 2024-05-07 ENCOUNTER — Ambulatory Visit

## 2024-05-14 ENCOUNTER — Ambulatory Visit

## 2024-08-28 ENCOUNTER — Ambulatory Visit
Admission: EM | Admit: 2024-08-28 | Discharge: 2024-08-28 | Disposition: A | Discharge status: Home / Self Care | Attending: Family Medicine | Admitting: Family Medicine

## 2024-08-28 DIAGNOSIS — M545 Low back pain, unspecified: Secondary | ICD-10-CM | POA: Diagnosis present

## 2024-08-28 DIAGNOSIS — R350 Frequency of micturition: Secondary | ICD-10-CM | POA: Diagnosis present

## 2024-08-28 DIAGNOSIS — N760 Acute vaginitis: Secondary | ICD-10-CM

## 2024-08-28 DIAGNOSIS — B9689 Other specified bacterial agents as the cause of diseases classified elsewhere: Secondary | ICD-10-CM

## 2024-08-28 LAB — POCT URINE DIPSTICK
Bilirubin, UA: NEGATIVE
Glucose, UA: NEGATIVE mg/dL
Ketones, POC UA: NEGATIVE mg/dL
Leukocytes, UA: NEGATIVE
Nitrite, UA: NEGATIVE
Spec Grav, UA: 1.02 (ref 1.010–1.025)
Urobilinogen, UA: 0.2 U/dL
pH, UA: 7 (ref 5.0–8.0)

## 2024-08-28 MED ORDER — METRONIDAZOLE 500 MG PO TABS: 500.0000 mg | ORAL_TABLET | Freq: Two times a day (BID) | ORAL | 0 refills | Status: AC

## 2024-08-28 MED ORDER — FLUCONAZOLE 150 MG PO TABS
150.0000 mg | ORAL_TABLET | ORAL | 0 refills | Status: AC
Start: 2024-08-28 — End: 2024-09-12

## 2024-08-28 NOTE — ED Triage Notes (Signed)
 Pt c/o vaginal d/c and itching and lower back pain x 2-3 days-no meds PTA-NAD-steady gait

## 2024-08-28 NOTE — Discharge Instructions (Signed)
 Please start metronidazole  and fluconazole  to address your concern for suspected recurring BV and yeast infection. Make sure you hydrate very well with plain water and a quantity of 80 ounces of water a day.  Please limit drinks that are considered urinary irritants such as fruit juices, soda, sweet tea, coffee, artifical sweetened drinks, energy drinks, alcohol.  These can worsen your urinary and genital symptoms but also be the source of them.  I will let you know about your vaginal swab and urine culture results through MyChart to see if we need to prescribe or change your antibiotics based off of those results.

## 2024-08-28 NOTE — ED Provider Notes (Signed)
 [facility]  Note:  This document was prepared using Conservation officer, historic buildings and may include unintentional dictation errors.  MRN: [id number] DOB: 01/31/1991  Subjective:   Leah Burns is a 33 y.o. female presenting for 3-day history of vaginal itching, discharge and low back pain, urinary frequency and urgency.  No concern for STI or pregnancy.  Patient has not been hydrating well with water.  Has been drinking Dr. Nunzio.  No current facility-administered medications for this encounter. No current outpatient medications on file.   No Known Allergies  Past Medical History:  Diagnosis Date   Asthma    Endometriosis    Family history of breast cancer 03/21/2022   MS (multiple sclerosis) [facility])      Past Surgical History:  Procedure Laterality Date   ABDOMINAL HYSTERECTOMY     partial   BREAST BIOPSY Right 04/30/2023   US  RT BREAST BX W LOC DEV 1ST LESION IMG BX SPEC US  GUIDE 04/30/2023 [facility] MAMMOGRAPHY   BREAST REDUCTION SURGERY Bilateral 09/14/2020   Procedure: BILATERAL MAMMARY REDUCTION  (BREAST);  Surgeon: Elisabeth Craig RAMAN, MD;  Location: [facility];  Service: Plastics;  Laterality: Bilateral;   DILATION AND EVACUATION      Family History  Problem Relation Age of Onset   Healthy Mother    Macular degeneration Mother    Breast cancer Mother        dx 48s   Melanoma Mother        back of arm   Hypertension Father    High Cholesterol Father    Breast cancer Maternal Grandmother        dx 4s   Lung cancer Maternal Grandmother 9   Lung cancer Maternal Grandfather    Cancer Paternal Grandfather        unknown type; ? prostate; ? colon; dx after 11   Breast cancer Maternal Great-grandmother        MGM's mother   Breast cancer Other        MGM's sister; dx 79s   Bone cancer Other        MGF's brothers, x2    Social History   Tobacco Use   Smoking status: Never   Smokeless tobacco: Never  Vaping Use    Vaping status: Every Day  Substance Use Topics   Alcohol use: Yes    Comment: occ   Drug use: Never    ROS   Objective:   Vitals: BP 114/78 (BP Location: Right Arm)   Pulse 98   Temp 98.3 F (36.8 C) (Oral)   Resp 16   LMP 06/02/2022 (Exact Date)   SpO2 95%   Physical Exam Constitutional:      General: She is not in acute distress.    Appearance: Normal appearance. She is well-developed. She is not ill-appearing, toxic-appearing or diaphoretic.  HENT:     Head: Normocephalic and atraumatic.     Nose: Nose normal.     Mouth/Throat:     Mouth: Mucous membranes are moist.  Eyes:     General: No scleral icterus.       Right eye: No discharge.        Left eye: No discharge.     Extraocular Movements: Extraocular movements intact.     Conjunctiva/sclera: Conjunctivae normal.  Cardiovascular:     Rate and Rhythm: Normal rate.  Pulmonary:     Effort: Pulmonary effort is normal.  Abdominal:     General: Bowel  sounds are normal. There is no distension.     Palpations: Abdomen is soft. There is no mass.     Tenderness: There is no abdominal tenderness. There is no right CVA tenderness, left CVA tenderness, guarding or rebound.  Musculoskeletal:     Lumbar back: No swelling, edema, deformity, signs of trauma, lacerations, spasms, tenderness or bony tenderness. Normal range of motion. Negative right straight leg raise test and negative left straight leg raise test. No scoliosis.  Skin:    General: Skin is warm and dry.  Neurological:     General: No focal deficit present.     Mental Status: She is alert and oriented to person, place, and time.     Motor: No weakness.     Coordination: Coordination normal.     Gait: Gait normal.     Deep Tendon Reflexes: Reflexes normal.  Psychiatric:        Mood and Affect: Mood normal.        Behavior: Behavior normal.        Thought Content: Thought content normal.        Judgment: Judgment normal.     Results for orders placed or  performed during the hospital encounter of 08/28/24 (from the past 24 hours)  POCT URINE DIPSTICK     Status: Abnormal   Collection Time: 08/28/24  9:30 AM  Result Value Ref Range   Color, UA yellow yellow   Clarity, UA clear clear   Glucose, UA negative negative mg/dL   Bilirubin, UA negative negative   Ketones, POC UA negative negative mg/dL   Spec Grav, UA 8.979 8.989 - 1.025   Blood, UA small (A) negative   pH, UA 7.0 5.0 - 8.0   Protein Ur, POC trace (A) negative mg/dL   Urobilinogen, UA 0.2 0.2 or 1.0 E.U./dL   Nitrite, UA Negative Negative   Leukocytes, UA Negative Negative    Assessment and Plan :   PDMP not reviewed this encounter.  1. Acute vaginitis   2. Bacterial vaginosis   3. Urinary frequency   4. Acute bilateral low back pain without sciatica    We will treat patient empirically for bacterial vaginosis with Flagyl  and for yeast vaginitis with fluconazole .  Labs pending.  Recommended hydrating more consistently and avoiding urinary irritants.  Use NSAID for low back pain.  Counseled patient on potential for adverse effects with medications prescribed/recommended today, ER and return-to-clinic precautions discussed, patient verbalized understanding.    Christopher Savannah, [place] 08/28/24 [phone]

## 2024-08-29 LAB — CERVICOVAGINAL ANCILLARY ONLY
Bacterial Vaginitis (gardnerella): POSITIVE — AB
Candida Glabrata: NEGATIVE
Candida Vaginitis: NEGATIVE
Comment: NEGATIVE
Comment: NEGATIVE
Comment: NEGATIVE

## 2024-08-30 LAB — URINE CULTURE: Culture: NO GROWTH

## 2025-04-23 ENCOUNTER — Inpatient Hospital Stay: Admitting: Hematology and Oncology
